# Patient Record
Sex: Male | Born: 1990 | Race: White | Hispanic: No | Marital: Single | Smoking: Never smoker
Health system: Southern US, Community
[De-identification: ages and names within clinical notes are randomized; demographics above are authoritative.]

## PROBLEM LIST (undated history)

## (undated) DIAGNOSIS — F419 Anxiety disorder, unspecified: Secondary | ICD-10-CM

## (undated) HISTORY — DX: Anxiety disorder, unspecified: F41.9

---
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
## 2011-03-09 LAB — URINALYSIS, ROUTINE W REFLEX MICROSCOPIC
Hgb urine dipstick: NEGATIVE
Nitrite: NEGATIVE
Specific Gravity, Urine: 1.018
Urobilinogen, UA: 0.2

## 2011-03-09 LAB — GC/CHLAMYDIA PROBE AMP, URINE
Chlamydia, Swab/Urine, PCR: NEGATIVE
GC Probe Amp, Urine: NEGATIVE

## 2011-03-09 LAB — RPR: RPR Ser Ql: NONREACTIVE

## 2015-09-01 DIAGNOSIS — F3342 Major depressive disorder, recurrent, in full remission: Secondary | ICD-10-CM | POA: Diagnosis not present

## 2015-09-15 DIAGNOSIS — F3342 Major depressive disorder, recurrent, in full remission: Secondary | ICD-10-CM | POA: Diagnosis not present

## 2015-10-06 DIAGNOSIS — F3342 Major depressive disorder, recurrent, in full remission: Secondary | ICD-10-CM | POA: Diagnosis not present

## 2015-11-17 DIAGNOSIS — F3342 Major depressive disorder, recurrent, in full remission: Secondary | ICD-10-CM | POA: Diagnosis not present

## 2015-12-18 DIAGNOSIS — K219 Gastro-esophageal reflux disease without esophagitis: Secondary | ICD-10-CM | POA: Diagnosis not present

## 2016-08-03 DIAGNOSIS — R202 Paresthesia of skin: Secondary | ICD-10-CM | POA: Diagnosis not present

## 2016-08-11 DIAGNOSIS — F3342 Major depressive disorder, recurrent, in full remission: Secondary | ICD-10-CM | POA: Diagnosis not present

## 2016-08-11 DIAGNOSIS — F1120 Opioid dependence, uncomplicated: Secondary | ICD-10-CM | POA: Diagnosis not present

## 2016-08-31 DIAGNOSIS — F3342 Major depressive disorder, recurrent, in full remission: Secondary | ICD-10-CM | POA: Diagnosis not present

## 2016-08-31 DIAGNOSIS — F1120 Opioid dependence, uncomplicated: Secondary | ICD-10-CM | POA: Diagnosis not present

## 2016-09-21 DIAGNOSIS — F1120 Opioid dependence, uncomplicated: Secondary | ICD-10-CM | POA: Diagnosis not present

## 2016-09-21 DIAGNOSIS — F3342 Major depressive disorder, recurrent, in full remission: Secondary | ICD-10-CM | POA: Diagnosis not present

## 2016-10-12 DIAGNOSIS — F1120 Opioid dependence, uncomplicated: Secondary | ICD-10-CM | POA: Diagnosis not present

## 2016-10-12 DIAGNOSIS — F3342 Major depressive disorder, recurrent, in full remission: Secondary | ICD-10-CM | POA: Diagnosis not present

## 2016-12-13 DIAGNOSIS — F1120 Opioid dependence, uncomplicated: Secondary | ICD-10-CM | POA: Diagnosis not present

## 2016-12-13 DIAGNOSIS — F3342 Major depressive disorder, recurrent, in full remission: Secondary | ICD-10-CM | POA: Diagnosis not present

## 2017-01-03 DIAGNOSIS — F1120 Opioid dependence, uncomplicated: Secondary | ICD-10-CM | POA: Diagnosis not present

## 2017-01-03 DIAGNOSIS — F3342 Major depressive disorder, recurrent, in full remission: Secondary | ICD-10-CM | POA: Diagnosis not present

## 2017-02-10 DIAGNOSIS — F3342 Major depressive disorder, recurrent, in full remission: Secondary | ICD-10-CM | POA: Diagnosis not present

## 2017-02-10 DIAGNOSIS — F1120 Opioid dependence, uncomplicated: Secondary | ICD-10-CM | POA: Diagnosis not present

## 2017-05-04 DIAGNOSIS — F1120 Opioid dependence, uncomplicated: Secondary | ICD-10-CM | POA: Diagnosis not present

## 2017-05-04 DIAGNOSIS — F3342 Major depressive disorder, recurrent, in full remission: Secondary | ICD-10-CM | POA: Diagnosis not present

## 2017-05-25 DIAGNOSIS — F1120 Opioid dependence, uncomplicated: Secondary | ICD-10-CM | POA: Diagnosis not present

## 2017-05-25 DIAGNOSIS — F3342 Major depressive disorder, recurrent, in full remission: Secondary | ICD-10-CM | POA: Diagnosis not present

## 2017-08-17 DIAGNOSIS — F1120 Opioid dependence, uncomplicated: Secondary | ICD-10-CM | POA: Diagnosis not present

## 2017-08-17 DIAGNOSIS — F3342 Major depressive disorder, recurrent, in full remission: Secondary | ICD-10-CM | POA: Diagnosis not present

## 2017-09-07 DIAGNOSIS — F1120 Opioid dependence, uncomplicated: Secondary | ICD-10-CM | POA: Diagnosis not present

## 2017-09-07 DIAGNOSIS — F3342 Major depressive disorder, recurrent, in full remission: Secondary | ICD-10-CM | POA: Diagnosis not present

## 2017-09-28 DIAGNOSIS — F1120 Opioid dependence, uncomplicated: Secondary | ICD-10-CM | POA: Diagnosis not present

## 2017-09-28 DIAGNOSIS — F3342 Major depressive disorder, recurrent, in full remission: Secondary | ICD-10-CM | POA: Diagnosis not present

## 2017-11-14 DIAGNOSIS — F3342 Major depressive disorder, recurrent, in full remission: Secondary | ICD-10-CM | POA: Diagnosis not present

## 2017-11-14 DIAGNOSIS — F1120 Opioid dependence, uncomplicated: Secondary | ICD-10-CM | POA: Diagnosis not present

## 2017-12-05 DIAGNOSIS — F3342 Major depressive disorder, recurrent, in full remission: Secondary | ICD-10-CM | POA: Diagnosis not present

## 2017-12-05 DIAGNOSIS — F1120 Opioid dependence, uncomplicated: Secondary | ICD-10-CM | POA: Diagnosis not present

## 2017-12-26 DIAGNOSIS — F3342 Major depressive disorder, recurrent, in full remission: Secondary | ICD-10-CM | POA: Diagnosis not present

## 2017-12-26 DIAGNOSIS — F1120 Opioid dependence, uncomplicated: Secondary | ICD-10-CM | POA: Diagnosis not present

## 2018-01-16 DIAGNOSIS — F3342 Major depressive disorder, recurrent, in full remission: Secondary | ICD-10-CM | POA: Diagnosis not present

## 2018-01-16 DIAGNOSIS — F1120 Opioid dependence, uncomplicated: Secondary | ICD-10-CM | POA: Diagnosis not present

## 2018-03-20 DIAGNOSIS — F3342 Major depressive disorder, recurrent, in full remission: Secondary | ICD-10-CM | POA: Diagnosis not present

## 2018-03-20 DIAGNOSIS — F1120 Opioid dependence, uncomplicated: Secondary | ICD-10-CM | POA: Diagnosis not present

## 2018-04-03 DIAGNOSIS — F3342 Major depressive disorder, recurrent, in full remission: Secondary | ICD-10-CM | POA: Diagnosis not present

## 2018-04-03 DIAGNOSIS — F1120 Opioid dependence, uncomplicated: Secondary | ICD-10-CM | POA: Diagnosis not present

## 2018-04-24 DIAGNOSIS — F1120 Opioid dependence, uncomplicated: Secondary | ICD-10-CM | POA: Diagnosis not present

## 2018-04-24 DIAGNOSIS — F3342 Major depressive disorder, recurrent, in full remission: Secondary | ICD-10-CM | POA: Diagnosis not present

## 2018-05-16 DIAGNOSIS — F1120 Opioid dependence, uncomplicated: Secondary | ICD-10-CM | POA: Diagnosis not present

## 2018-05-16 DIAGNOSIS — F3342 Major depressive disorder, recurrent, in full remission: Secondary | ICD-10-CM | POA: Diagnosis not present

## 2018-06-20 DIAGNOSIS — J329 Chronic sinusitis, unspecified: Secondary | ICD-10-CM | POA: Diagnosis not present

## 2018-06-20 DIAGNOSIS — H8122 Vestibular neuronitis, left ear: Secondary | ICD-10-CM | POA: Diagnosis not present

## 2018-06-20 DIAGNOSIS — Z6834 Body mass index (BMI) 34.0-34.9, adult: Secondary | ICD-10-CM | POA: Diagnosis not present

## 2018-06-27 DIAGNOSIS — F1120 Opioid dependence, uncomplicated: Secondary | ICD-10-CM | POA: Diagnosis not present

## 2018-06-27 DIAGNOSIS — F3342 Major depressive disorder, recurrent, in full remission: Secondary | ICD-10-CM | POA: Diagnosis not present

## 2018-07-18 DIAGNOSIS — F3342 Major depressive disorder, recurrent, in full remission: Secondary | ICD-10-CM | POA: Diagnosis not present

## 2018-08-08 DIAGNOSIS — F3342 Major depressive disorder, recurrent, in full remission: Secondary | ICD-10-CM | POA: Diagnosis not present

## 2018-10-09 DIAGNOSIS — F1120 Opioid dependence, uncomplicated: Secondary | ICD-10-CM | POA: Diagnosis not present

## 2018-10-09 DIAGNOSIS — F3342 Major depressive disorder, recurrent, in full remission: Secondary | ICD-10-CM | POA: Diagnosis not present

## 2018-11-16 DIAGNOSIS — F1120 Opioid dependence, uncomplicated: Secondary | ICD-10-CM | POA: Diagnosis not present

## 2018-11-16 DIAGNOSIS — F3342 Major depressive disorder, recurrent, in full remission: Secondary | ICD-10-CM | POA: Diagnosis not present

## 2019-01-18 DIAGNOSIS — F329 Major depressive disorder, single episode, unspecified: Secondary | ICD-10-CM | POA: Diagnosis not present

## 2019-01-18 DIAGNOSIS — F1120 Opioid dependence, uncomplicated: Secondary | ICD-10-CM | POA: Diagnosis not present

## 2019-05-17 DIAGNOSIS — J069 Acute upper respiratory infection, unspecified: Secondary | ICD-10-CM | POA: Diagnosis not present

## 2019-06-11 DIAGNOSIS — Z79899 Other long term (current) drug therapy: Secondary | ICD-10-CM | POA: Diagnosis not present

## 2019-06-15 DIAGNOSIS — J069 Acute upper respiratory infection, unspecified: Secondary | ICD-10-CM | POA: Diagnosis not present

## 2019-06-15 DIAGNOSIS — Z13220 Encounter for screening for lipoid disorders: Secondary | ICD-10-CM | POA: Diagnosis not present

## 2019-06-15 DIAGNOSIS — Z0000 Encounter for general adult medical examination without abnormal findings: Secondary | ICD-10-CM | POA: Diagnosis not present

## 2019-06-15 DIAGNOSIS — Z131 Encounter for screening for diabetes mellitus: Secondary | ICD-10-CM | POA: Diagnosis not present

## 2019-06-20 DIAGNOSIS — J069 Acute upper respiratory infection, unspecified: Secondary | ICD-10-CM | POA: Diagnosis not present

## 2019-06-20 DIAGNOSIS — Z20828 Contact with and (suspected) exposure to other viral communicable diseases: Secondary | ICD-10-CM | POA: Diagnosis not present

## 2019-06-26 ENCOUNTER — Ambulatory Visit
Admission: RE | Admit: 2019-06-26 | Discharge: 2019-06-26 | Disposition: A | Discharge status: Home / Self Care | Payer: BC Managed Care – PPO | Source: Ambulatory Visit | Attending: Obstetrics and Gynecology | Admitting: Obstetrics and Gynecology

## 2019-06-26 ENCOUNTER — Other Ambulatory Visit: Payer: Self-pay | Admitting: Obstetrics and Gynecology

## 2019-06-26 DIAGNOSIS — R509 Fever, unspecified: Secondary | ICD-10-CM

## 2019-06-26 DIAGNOSIS — Z20828 Contact with and (suspected) exposure to other viral communicable diseases: Secondary | ICD-10-CM | POA: Diagnosis not present

## 2019-06-26 DIAGNOSIS — J069 Acute upper respiratory infection, unspecified: Secondary | ICD-10-CM | POA: Diagnosis not present

## 2019-07-09 DIAGNOSIS — Z79899 Other long term (current) drug therapy: Secondary | ICD-10-CM | POA: Diagnosis not present

## 2020-03-11 DIAGNOSIS — Z79899 Other long term (current) drug therapy: Secondary | ICD-10-CM | POA: Diagnosis not present

## 2020-03-11 DIAGNOSIS — F411 Generalized anxiety disorder: Secondary | ICD-10-CM | POA: Diagnosis not present

## 2020-03-11 DIAGNOSIS — F1121 Opioid dependence, in remission: Secondary | ICD-10-CM | POA: Diagnosis not present

## 2020-03-11 DIAGNOSIS — F330 Major depressive disorder, recurrent, mild: Secondary | ICD-10-CM | POA: Diagnosis not present

## 2020-03-25 DIAGNOSIS — F411 Generalized anxiety disorder: Secondary | ICD-10-CM | POA: Diagnosis not present

## 2020-03-25 DIAGNOSIS — F1121 Opioid dependence, in remission: Secondary | ICD-10-CM | POA: Diagnosis not present

## 2020-03-25 DIAGNOSIS — Z79899 Other long term (current) drug therapy: Secondary | ICD-10-CM | POA: Diagnosis not present

## 2020-03-25 DIAGNOSIS — F330 Major depressive disorder, recurrent, mild: Secondary | ICD-10-CM | POA: Diagnosis not present

## 2020-04-08 DIAGNOSIS — F1121 Opioid dependence, in remission: Secondary | ICD-10-CM | POA: Diagnosis not present

## 2020-04-08 DIAGNOSIS — F330 Major depressive disorder, recurrent, mild: Secondary | ICD-10-CM | POA: Diagnosis not present

## 2020-04-08 DIAGNOSIS — F1910 Other psychoactive substance abuse, uncomplicated: Secondary | ICD-10-CM | POA: Diagnosis not present

## 2020-04-08 DIAGNOSIS — F411 Generalized anxiety disorder: Secondary | ICD-10-CM | POA: Diagnosis not present

## 2020-04-22 DIAGNOSIS — Z79899 Other long term (current) drug therapy: Secondary | ICD-10-CM | POA: Diagnosis not present

## 2020-04-22 DIAGNOSIS — F411 Generalized anxiety disorder: Secondary | ICD-10-CM | POA: Diagnosis not present

## 2020-04-22 DIAGNOSIS — F1121 Opioid dependence, in remission: Secondary | ICD-10-CM | POA: Diagnosis not present

## 2020-04-22 DIAGNOSIS — F330 Major depressive disorder, recurrent, mild: Secondary | ICD-10-CM | POA: Diagnosis not present

## 2020-05-06 DIAGNOSIS — F411 Generalized anxiety disorder: Secondary | ICD-10-CM | POA: Diagnosis not present

## 2020-05-06 DIAGNOSIS — Z79899 Other long term (current) drug therapy: Secondary | ICD-10-CM | POA: Diagnosis not present

## 2020-05-06 DIAGNOSIS — F1121 Opioid dependence, in remission: Secondary | ICD-10-CM | POA: Diagnosis not present

## 2020-05-06 DIAGNOSIS — F330 Major depressive disorder, recurrent, mild: Secondary | ICD-10-CM | POA: Diagnosis not present

## 2020-05-20 DIAGNOSIS — F411 Generalized anxiety disorder: Secondary | ICD-10-CM | POA: Diagnosis not present

## 2020-05-20 DIAGNOSIS — F330 Major depressive disorder, recurrent, mild: Secondary | ICD-10-CM | POA: Diagnosis not present

## 2020-05-20 DIAGNOSIS — Z79899 Other long term (current) drug therapy: Secondary | ICD-10-CM | POA: Diagnosis not present

## 2020-05-20 DIAGNOSIS — F1121 Opioid dependence, in remission: Secondary | ICD-10-CM | POA: Diagnosis not present

## 2020-06-03 DIAGNOSIS — Z79899 Other long term (current) drug therapy: Secondary | ICD-10-CM | POA: Diagnosis not present

## 2020-06-03 DIAGNOSIS — F411 Generalized anxiety disorder: Secondary | ICD-10-CM | POA: Diagnosis not present

## 2020-06-03 DIAGNOSIS — F1121 Opioid dependence, in remission: Secondary | ICD-10-CM | POA: Diagnosis not present

## 2020-06-03 DIAGNOSIS — F330 Major depressive disorder, recurrent, mild: Secondary | ICD-10-CM | POA: Diagnosis not present

## 2020-06-17 DIAGNOSIS — F411 Generalized anxiety disorder: Secondary | ICD-10-CM | POA: Diagnosis not present

## 2020-06-17 DIAGNOSIS — F1121 Opioid dependence, in remission: Secondary | ICD-10-CM | POA: Diagnosis not present

## 2020-06-17 DIAGNOSIS — F330 Major depressive disorder, recurrent, mild: Secondary | ICD-10-CM | POA: Diagnosis not present

## 2020-06-17 DIAGNOSIS — Z79899 Other long term (current) drug therapy: Secondary | ICD-10-CM | POA: Diagnosis not present

## 2020-07-01 DIAGNOSIS — F411 Generalized anxiety disorder: Secondary | ICD-10-CM | POA: Diagnosis not present

## 2020-07-01 DIAGNOSIS — F1121 Opioid dependence, in remission: Secondary | ICD-10-CM | POA: Diagnosis not present

## 2020-07-01 DIAGNOSIS — F330 Major depressive disorder, recurrent, mild: Secondary | ICD-10-CM | POA: Diagnosis not present

## 2020-07-01 DIAGNOSIS — Z79899 Other long term (current) drug therapy: Secondary | ICD-10-CM | POA: Diagnosis not present

## 2020-07-15 DIAGNOSIS — F411 Generalized anxiety disorder: Secondary | ICD-10-CM | POA: Diagnosis not present

## 2020-07-15 DIAGNOSIS — F330 Major depressive disorder, recurrent, mild: Secondary | ICD-10-CM | POA: Diagnosis not present

## 2020-07-15 DIAGNOSIS — Z79899 Other long term (current) drug therapy: Secondary | ICD-10-CM | POA: Diagnosis not present

## 2020-07-15 DIAGNOSIS — F1121 Opioid dependence, in remission: Secondary | ICD-10-CM | POA: Diagnosis not present

## 2020-08-04 DIAGNOSIS — F411 Generalized anxiety disorder: Secondary | ICD-10-CM | POA: Diagnosis not present

## 2020-08-04 DIAGNOSIS — Z79899 Other long term (current) drug therapy: Secondary | ICD-10-CM | POA: Diagnosis not present

## 2020-08-04 DIAGNOSIS — F330 Major depressive disorder, recurrent, mild: Secondary | ICD-10-CM | POA: Diagnosis not present

## 2020-08-04 DIAGNOSIS — F1121 Opioid dependence, in remission: Secondary | ICD-10-CM | POA: Diagnosis not present

## 2020-08-18 DIAGNOSIS — F411 Generalized anxiety disorder: Secondary | ICD-10-CM | POA: Diagnosis not present

## 2020-08-18 DIAGNOSIS — Z79899 Other long term (current) drug therapy: Secondary | ICD-10-CM | POA: Diagnosis not present

## 2020-08-18 DIAGNOSIS — F1121 Opioid dependence, in remission: Secondary | ICD-10-CM | POA: Diagnosis not present

## 2020-08-18 DIAGNOSIS — F330 Major depressive disorder, recurrent, mild: Secondary | ICD-10-CM | POA: Diagnosis not present

## 2020-09-01 DIAGNOSIS — F411 Generalized anxiety disorder: Secondary | ICD-10-CM | POA: Diagnosis not present

## 2020-09-01 DIAGNOSIS — F1121 Opioid dependence, in remission: Secondary | ICD-10-CM | POA: Diagnosis not present

## 2020-09-01 DIAGNOSIS — Z79899 Other long term (current) drug therapy: Secondary | ICD-10-CM | POA: Diagnosis not present

## 2020-09-01 DIAGNOSIS — F330 Major depressive disorder, recurrent, mild: Secondary | ICD-10-CM | POA: Diagnosis not present

## 2020-09-15 DIAGNOSIS — F411 Generalized anxiety disorder: Secondary | ICD-10-CM | POA: Diagnosis not present

## 2020-09-15 DIAGNOSIS — F1121 Opioid dependence, in remission: Secondary | ICD-10-CM | POA: Diagnosis not present

## 2020-09-15 DIAGNOSIS — Z79899 Other long term (current) drug therapy: Secondary | ICD-10-CM | POA: Diagnosis not present

## 2020-09-15 DIAGNOSIS — F330 Major depressive disorder, recurrent, mild: Secondary | ICD-10-CM | POA: Diagnosis not present

## 2020-09-26 DIAGNOSIS — F411 Generalized anxiety disorder: Secondary | ICD-10-CM | POA: Diagnosis not present

## 2020-09-26 DIAGNOSIS — F1121 Opioid dependence, in remission: Secondary | ICD-10-CM | POA: Diagnosis not present

## 2020-09-26 DIAGNOSIS — Z79899 Other long term (current) drug therapy: Secondary | ICD-10-CM | POA: Diagnosis not present

## 2020-09-26 DIAGNOSIS — F330 Major depressive disorder, recurrent, mild: Secondary | ICD-10-CM | POA: Diagnosis not present

## 2020-10-10 DIAGNOSIS — F1121 Opioid dependence, in remission: Secondary | ICD-10-CM | POA: Diagnosis not present

## 2020-10-10 DIAGNOSIS — F411 Generalized anxiety disorder: Secondary | ICD-10-CM | POA: Diagnosis not present

## 2020-10-10 DIAGNOSIS — F330 Major depressive disorder, recurrent, mild: Secondary | ICD-10-CM | POA: Diagnosis not present

## 2020-10-10 DIAGNOSIS — Z79899 Other long term (current) drug therapy: Secondary | ICD-10-CM | POA: Diagnosis not present

## 2020-10-24 DIAGNOSIS — F330 Major depressive disorder, recurrent, mild: Secondary | ICD-10-CM | POA: Diagnosis not present

## 2020-10-24 DIAGNOSIS — F1910 Other psychoactive substance abuse, uncomplicated: Secondary | ICD-10-CM | POA: Diagnosis not present

## 2020-10-24 DIAGNOSIS — F411 Generalized anxiety disorder: Secondary | ICD-10-CM | POA: Diagnosis not present

## 2020-10-24 DIAGNOSIS — F1121 Opioid dependence, in remission: Secondary | ICD-10-CM | POA: Diagnosis not present

## 2020-11-07 DIAGNOSIS — F1121 Opioid dependence, in remission: Secondary | ICD-10-CM | POA: Diagnosis not present

## 2020-11-07 DIAGNOSIS — Z6836 Body mass index (BMI) 36.0-36.9, adult: Secondary | ICD-10-CM | POA: Diagnosis not present

## 2020-11-07 DIAGNOSIS — Z79899 Other long term (current) drug therapy: Secondary | ICD-10-CM | POA: Diagnosis not present

## 2020-11-07 DIAGNOSIS — F411 Generalized anxiety disorder: Secondary | ICD-10-CM | POA: Diagnosis not present

## 2020-11-07 DIAGNOSIS — F330 Major depressive disorder, recurrent, mild: Secondary | ICD-10-CM | POA: Diagnosis not present

## 2020-11-28 DIAGNOSIS — F330 Major depressive disorder, recurrent, mild: Secondary | ICD-10-CM | POA: Diagnosis not present

## 2020-11-28 DIAGNOSIS — F1910 Other psychoactive substance abuse, uncomplicated: Secondary | ICD-10-CM | POA: Diagnosis not present

## 2020-11-28 DIAGNOSIS — F1121 Opioid dependence, in remission: Secondary | ICD-10-CM | POA: Diagnosis not present

## 2020-11-28 DIAGNOSIS — F411 Generalized anxiety disorder: Secondary | ICD-10-CM | POA: Diagnosis not present

## 2020-12-12 DIAGNOSIS — F1910 Other psychoactive substance abuse, uncomplicated: Secondary | ICD-10-CM | POA: Diagnosis not present

## 2020-12-12 DIAGNOSIS — F1121 Opioid dependence, in remission: Secondary | ICD-10-CM | POA: Diagnosis not present

## 2020-12-12 DIAGNOSIS — F330 Major depressive disorder, recurrent, mild: Secondary | ICD-10-CM | POA: Diagnosis not present

## 2020-12-12 DIAGNOSIS — F411 Generalized anxiety disorder: Secondary | ICD-10-CM | POA: Diagnosis not present

## 2020-12-26 DIAGNOSIS — F330 Major depressive disorder, recurrent, mild: Secondary | ICD-10-CM | POA: Diagnosis not present

## 2020-12-26 DIAGNOSIS — F411 Generalized anxiety disorder: Secondary | ICD-10-CM | POA: Diagnosis not present

## 2020-12-26 DIAGNOSIS — F1121 Opioid dependence, in remission: Secondary | ICD-10-CM | POA: Diagnosis not present

## 2020-12-26 DIAGNOSIS — F1910 Other psychoactive substance abuse, uncomplicated: Secondary | ICD-10-CM | POA: Diagnosis not present

## 2021-01-08 DIAGNOSIS — F411 Generalized anxiety disorder: Secondary | ICD-10-CM | POA: Diagnosis not present

## 2021-01-08 DIAGNOSIS — F1910 Other psychoactive substance abuse, uncomplicated: Secondary | ICD-10-CM | POA: Diagnosis not present

## 2021-01-08 DIAGNOSIS — F1121 Opioid dependence, in remission: Secondary | ICD-10-CM | POA: Diagnosis not present

## 2021-01-08 DIAGNOSIS — F330 Major depressive disorder, recurrent, mild: Secondary | ICD-10-CM | POA: Diagnosis not present

## 2021-01-27 DIAGNOSIS — Z79899 Other long term (current) drug therapy: Secondary | ICD-10-CM | POA: Diagnosis not present

## 2021-01-27 DIAGNOSIS — F411 Generalized anxiety disorder: Secondary | ICD-10-CM | POA: Diagnosis not present

## 2021-01-27 DIAGNOSIS — F1120 Opioid dependence, uncomplicated: Secondary | ICD-10-CM | POA: Diagnosis not present

## 2021-01-27 DIAGNOSIS — Z6836 Body mass index (BMI) 36.0-36.9, adult: Secondary | ICD-10-CM | POA: Diagnosis not present

## 2021-01-27 DIAGNOSIS — F330 Major depressive disorder, recurrent, mild: Secondary | ICD-10-CM | POA: Diagnosis not present

## 2021-02-10 DIAGNOSIS — F1910 Other psychoactive substance abuse, uncomplicated: Secondary | ICD-10-CM | POA: Diagnosis not present

## 2021-02-10 DIAGNOSIS — F411 Generalized anxiety disorder: Secondary | ICD-10-CM | POA: Diagnosis not present

## 2021-02-10 DIAGNOSIS — F1120 Opioid dependence, uncomplicated: Secondary | ICD-10-CM | POA: Diagnosis not present

## 2021-02-10 DIAGNOSIS — F330 Major depressive disorder, recurrent, mild: Secondary | ICD-10-CM | POA: Diagnosis not present

## 2021-02-24 DIAGNOSIS — F411 Generalized anxiety disorder: Secondary | ICD-10-CM | POA: Diagnosis not present

## 2021-02-24 DIAGNOSIS — Z79899 Other long term (current) drug therapy: Secondary | ICD-10-CM | POA: Diagnosis not present

## 2021-02-24 DIAGNOSIS — Z6836 Body mass index (BMI) 36.0-36.9, adult: Secondary | ICD-10-CM | POA: Diagnosis not present

## 2021-02-24 DIAGNOSIS — F330 Major depressive disorder, recurrent, mild: Secondary | ICD-10-CM | POA: Diagnosis not present

## 2021-02-24 DIAGNOSIS — F1120 Opioid dependence, uncomplicated: Secondary | ICD-10-CM | POA: Diagnosis not present

## 2021-03-24 DIAGNOSIS — F1910 Other psychoactive substance abuse, uncomplicated: Secondary | ICD-10-CM | POA: Diagnosis not present

## 2021-03-24 DIAGNOSIS — F330 Major depressive disorder, recurrent, mild: Secondary | ICD-10-CM | POA: Diagnosis not present

## 2021-03-24 DIAGNOSIS — F1120 Opioid dependence, uncomplicated: Secondary | ICD-10-CM | POA: Diagnosis not present

## 2021-03-24 DIAGNOSIS — F411 Generalized anxiety disorder: Secondary | ICD-10-CM | POA: Diagnosis not present

## 2021-04-08 ENCOUNTER — Other Ambulatory Visit: Payer: Self-pay

## 2021-04-08 ENCOUNTER — Other Ambulatory Visit: Payer: Self-pay | Admitting: Family

## 2021-04-08 ENCOUNTER — Ambulatory Visit
Admission: RE | Admit: 2021-04-08 | Discharge: 2021-04-08 | Disposition: A | Discharge status: Home / Self Care | Payer: BC Managed Care – PPO | Source: Ambulatory Visit | Attending: Family | Admitting: Family

## 2021-04-08 DIAGNOSIS — R52 Pain, unspecified: Secondary | ICD-10-CM

## 2021-04-08 DIAGNOSIS — M545 Low back pain, unspecified: Secondary | ICD-10-CM | POA: Diagnosis not present

## 2021-04-09 DIAGNOSIS — F411 Generalized anxiety disorder: Secondary | ICD-10-CM | POA: Diagnosis not present

## 2021-04-09 DIAGNOSIS — F1120 Opioid dependence, uncomplicated: Secondary | ICD-10-CM | POA: Diagnosis not present

## 2021-04-09 DIAGNOSIS — F330 Major depressive disorder, recurrent, mild: Secondary | ICD-10-CM | POA: Diagnosis not present

## 2021-04-09 DIAGNOSIS — Z79899 Other long term (current) drug therapy: Secondary | ICD-10-CM | POA: Diagnosis not present

## 2021-04-22 DIAGNOSIS — Z79899 Other long term (current) drug therapy: Secondary | ICD-10-CM | POA: Diagnosis not present

## 2021-04-22 DIAGNOSIS — F411 Generalized anxiety disorder: Secondary | ICD-10-CM | POA: Diagnosis not present

## 2021-04-22 DIAGNOSIS — F1120 Opioid dependence, uncomplicated: Secondary | ICD-10-CM | POA: Diagnosis not present

## 2021-04-22 DIAGNOSIS — F330 Major depressive disorder, recurrent, mild: Secondary | ICD-10-CM | POA: Diagnosis not present

## 2021-05-06 DIAGNOSIS — F411 Generalized anxiety disorder: Secondary | ICD-10-CM | POA: Diagnosis not present

## 2021-05-06 DIAGNOSIS — F1120 Opioid dependence, uncomplicated: Secondary | ICD-10-CM | POA: Diagnosis not present

## 2021-05-06 DIAGNOSIS — F330 Major depressive disorder, recurrent, mild: Secondary | ICD-10-CM | POA: Diagnosis not present

## 2021-05-06 DIAGNOSIS — Z6833 Body mass index (BMI) 33.0-33.9, adult: Secondary | ICD-10-CM | POA: Diagnosis not present

## 2021-05-06 DIAGNOSIS — Z79899 Other long term (current) drug therapy: Secondary | ICD-10-CM | POA: Diagnosis not present

## 2021-05-20 DIAGNOSIS — F1120 Opioid dependence, uncomplicated: Secondary | ICD-10-CM | POA: Diagnosis not present

## 2021-05-20 DIAGNOSIS — F330 Major depressive disorder, recurrent, mild: Secondary | ICD-10-CM | POA: Diagnosis not present

## 2021-05-20 DIAGNOSIS — F411 Generalized anxiety disorder: Secondary | ICD-10-CM | POA: Diagnosis not present

## 2021-06-03 DIAGNOSIS — F1120 Opioid dependence, uncomplicated: Secondary | ICD-10-CM | POA: Diagnosis not present

## 2021-06-03 DIAGNOSIS — Z6833 Body mass index (BMI) 33.0-33.9, adult: Secondary | ICD-10-CM | POA: Diagnosis not present

## 2021-06-03 DIAGNOSIS — F330 Major depressive disorder, recurrent, mild: Secondary | ICD-10-CM | POA: Diagnosis not present

## 2021-06-03 DIAGNOSIS — F411 Generalized anxiety disorder: Secondary | ICD-10-CM | POA: Diagnosis not present

## 2021-06-03 DIAGNOSIS — Z79899 Other long term (current) drug therapy: Secondary | ICD-10-CM | POA: Diagnosis not present

## 2021-06-04 DIAGNOSIS — Z6832 Body mass index (BMI) 32.0-32.9, adult: Secondary | ICD-10-CM | POA: Diagnosis not present

## 2021-06-04 DIAGNOSIS — F332 Major depressive disorder, recurrent severe without psychotic features: Secondary | ICD-10-CM | POA: Diagnosis not present

## 2021-06-04 DIAGNOSIS — E669 Obesity, unspecified: Secondary | ICD-10-CM | POA: Diagnosis not present

## 2021-06-17 DIAGNOSIS — Z79899 Other long term (current) drug therapy: Secondary | ICD-10-CM | POA: Diagnosis not present

## 2021-06-17 DIAGNOSIS — F330 Major depressive disorder, recurrent, mild: Secondary | ICD-10-CM | POA: Diagnosis not present

## 2021-06-17 DIAGNOSIS — F1120 Opioid dependence, uncomplicated: Secondary | ICD-10-CM | POA: Diagnosis not present

## 2021-06-17 DIAGNOSIS — F411 Generalized anxiety disorder: Secondary | ICD-10-CM | POA: Diagnosis not present

## 2021-06-19 DIAGNOSIS — Z6831 Body mass index (BMI) 31.0-31.9, adult: Secondary | ICD-10-CM | POA: Diagnosis not present

## 2021-06-19 DIAGNOSIS — F332 Major depressive disorder, recurrent severe without psychotic features: Secondary | ICD-10-CM | POA: Diagnosis not present

## 2021-06-19 DIAGNOSIS — E669 Obesity, unspecified: Secondary | ICD-10-CM | POA: Diagnosis not present

## 2021-07-01 DIAGNOSIS — F330 Major depressive disorder, recurrent, mild: Secondary | ICD-10-CM | POA: Diagnosis not present

## 2021-07-01 DIAGNOSIS — F411 Generalized anxiety disorder: Secondary | ICD-10-CM | POA: Diagnosis not present

## 2021-07-01 DIAGNOSIS — F1120 Opioid dependence, uncomplicated: Secondary | ICD-10-CM | POA: Diagnosis not present

## 2021-07-01 DIAGNOSIS — Z79899 Other long term (current) drug therapy: Secondary | ICD-10-CM | POA: Diagnosis not present

## 2021-07-15 DIAGNOSIS — F1120 Opioid dependence, uncomplicated: Secondary | ICD-10-CM | POA: Diagnosis not present

## 2021-07-15 DIAGNOSIS — F330 Major depressive disorder, recurrent, mild: Secondary | ICD-10-CM | POA: Diagnosis not present

## 2021-07-15 DIAGNOSIS — Z79899 Other long term (current) drug therapy: Secondary | ICD-10-CM | POA: Diagnosis not present

## 2021-07-15 DIAGNOSIS — F411 Generalized anxiety disorder: Secondary | ICD-10-CM | POA: Diagnosis not present

## 2021-07-29 DIAGNOSIS — F330 Major depressive disorder, recurrent, mild: Secondary | ICD-10-CM | POA: Diagnosis not present

## 2021-07-29 DIAGNOSIS — F1120 Opioid dependence, uncomplicated: Secondary | ICD-10-CM | POA: Diagnosis not present

## 2021-07-29 DIAGNOSIS — Z79899 Other long term (current) drug therapy: Secondary | ICD-10-CM | POA: Diagnosis not present

## 2021-07-29 DIAGNOSIS — Z6833 Body mass index (BMI) 33.0-33.9, adult: Secondary | ICD-10-CM | POA: Diagnosis not present

## 2021-07-29 DIAGNOSIS — F411 Generalized anxiety disorder: Secondary | ICD-10-CM | POA: Diagnosis not present

## 2021-08-12 DIAGNOSIS — F330 Major depressive disorder, recurrent, mild: Secondary | ICD-10-CM | POA: Diagnosis not present

## 2021-08-12 DIAGNOSIS — Z6833 Body mass index (BMI) 33.0-33.9, adult: Secondary | ICD-10-CM | POA: Diagnosis not present

## 2021-08-12 DIAGNOSIS — F1120 Opioid dependence, uncomplicated: Secondary | ICD-10-CM | POA: Diagnosis not present

## 2021-08-12 DIAGNOSIS — F411 Generalized anxiety disorder: Secondary | ICD-10-CM | POA: Diagnosis not present

## 2021-08-26 DIAGNOSIS — Z6829 Body mass index (BMI) 29.0-29.9, adult: Secondary | ICD-10-CM | POA: Diagnosis not present

## 2021-08-26 DIAGNOSIS — Z79899 Other long term (current) drug therapy: Secondary | ICD-10-CM | POA: Diagnosis not present

## 2021-08-26 DIAGNOSIS — F1120 Opioid dependence, uncomplicated: Secondary | ICD-10-CM | POA: Diagnosis not present

## 2021-08-26 DIAGNOSIS — F330 Major depressive disorder, recurrent, mild: Secondary | ICD-10-CM | POA: Diagnosis not present

## 2021-08-26 DIAGNOSIS — F411 Generalized anxiety disorder: Secondary | ICD-10-CM | POA: Diagnosis not present

## 2021-09-09 DIAGNOSIS — F330 Major depressive disorder, recurrent, mild: Secondary | ICD-10-CM | POA: Diagnosis not present

## 2021-09-09 DIAGNOSIS — Z6829 Body mass index (BMI) 29.0-29.9, adult: Secondary | ICD-10-CM | POA: Diagnosis not present

## 2021-09-09 DIAGNOSIS — F411 Generalized anxiety disorder: Secondary | ICD-10-CM | POA: Diagnosis not present

## 2021-09-09 DIAGNOSIS — F1120 Opioid dependence, uncomplicated: Secondary | ICD-10-CM | POA: Diagnosis not present

## 2021-09-22 DIAGNOSIS — E663 Overweight: Secondary | ICD-10-CM | POA: Diagnosis not present

## 2021-09-22 DIAGNOSIS — F332 Major depressive disorder, recurrent severe without psychotic features: Secondary | ICD-10-CM | POA: Diagnosis not present

## 2021-09-22 DIAGNOSIS — Z6827 Body mass index (BMI) 27.0-27.9, adult: Secondary | ICD-10-CM | POA: Diagnosis not present

## 2021-09-23 DIAGNOSIS — Z79899 Other long term (current) drug therapy: Secondary | ICD-10-CM | POA: Diagnosis not present

## 2021-09-23 DIAGNOSIS — F330 Major depressive disorder, recurrent, mild: Secondary | ICD-10-CM | POA: Diagnosis not present

## 2021-09-23 DIAGNOSIS — F411 Generalized anxiety disorder: Secondary | ICD-10-CM | POA: Diagnosis not present

## 2021-09-23 DIAGNOSIS — F1120 Opioid dependence, uncomplicated: Secondary | ICD-10-CM | POA: Diagnosis not present

## 2021-09-23 DIAGNOSIS — Z6829 Body mass index (BMI) 29.0-29.9, adult: Secondary | ICD-10-CM | POA: Diagnosis not present

## 2021-10-07 DIAGNOSIS — F1120 Opioid dependence, uncomplicated: Secondary | ICD-10-CM | POA: Diagnosis not present

## 2021-10-07 DIAGNOSIS — F411 Generalized anxiety disorder: Secondary | ICD-10-CM | POA: Diagnosis not present

## 2021-10-07 DIAGNOSIS — Z6829 Body mass index (BMI) 29.0-29.9, adult: Secondary | ICD-10-CM | POA: Diagnosis not present

## 2021-10-07 DIAGNOSIS — F330 Major depressive disorder, recurrent, mild: Secondary | ICD-10-CM | POA: Diagnosis not present

## 2021-10-21 DIAGNOSIS — Z6829 Body mass index (BMI) 29.0-29.9, adult: Secondary | ICD-10-CM | POA: Diagnosis not present

## 2021-10-21 DIAGNOSIS — Z79899 Other long term (current) drug therapy: Secondary | ICD-10-CM | POA: Diagnosis not present

## 2021-10-21 DIAGNOSIS — F330 Major depressive disorder, recurrent, mild: Secondary | ICD-10-CM | POA: Diagnosis not present

## 2021-10-21 DIAGNOSIS — F411 Generalized anxiety disorder: Secondary | ICD-10-CM | POA: Diagnosis not present

## 2021-10-21 DIAGNOSIS — F1120 Opioid dependence, uncomplicated: Secondary | ICD-10-CM | POA: Diagnosis not present

## 2021-11-03 ENCOUNTER — Ambulatory Visit
Admission: EM | Admit: 2021-11-03 | Discharge: 2021-11-03 | Disposition: A | Discharge status: Home / Self Care | Payer: BC Managed Care – PPO | Attending: Nurse Practitioner | Admitting: Nurse Practitioner

## 2021-11-03 DIAGNOSIS — J029 Acute pharyngitis, unspecified: Secondary | ICD-10-CM | POA: Diagnosis not present

## 2021-11-03 DIAGNOSIS — J069 Acute upper respiratory infection, unspecified: Secondary | ICD-10-CM

## 2021-11-03 LAB — POCT RAPID STREP A (OFFICE): Rapid Strep A Screen: NEGATIVE

## 2021-11-03 MED ORDER — LIDOCAINE VISCOUS HCL 2 % MT SOLN: 5.0000 mL | OROMUCOSAL | 0 refills | Status: AC | PRN

## 2021-11-03 NOTE — ED Provider Notes (Signed)
[facility]    CSN: [id number] Arrival date & time: 11/03/21  1023      History   Chief Complaint Chief Complaint  Patient presents with   Sore Throat    Sore throat and head stopped up    HPI Scott Maxwell is a 31 y.o. male.   The history is provided by the patient.   Presents for complaints of sore throat that has been present for the past 2 days.  Over the past 24 hours, patient has since developed fever and nasal congestion.  He denies headache, ear pain, cough, shortness of breath, or wheezing.  He also denies any GI symptoms.  States he has been taking Advil Cold and Sinus for his symptoms.  States he was around his nieces and nephews who were sick, but for short duration.  History reviewed. No pertinent past medical history.  There are no problems to display for this patient.   History reviewed. No pertinent surgical history.     Home Medications    Prior to Admission medications   Medication Sig Start Date End Date Taking? Authorizing Provider  lidocaine (XYLOCAINE) 2 % solution Use as directed 5 mLs in the mouth or throat as needed for mouth pain. Gargle and spit 5mL as needed for throat pain. 11/03/21  Yes Clevie Prout-Warren, Sadie Haberhristie J, NP  buPROPion (WELLBUTRIN XL) 150 MG 24 hr tablet Take 150 mg by mouth daily. 09/25/21   [provider]  citalopram (CELEXA) 40 MG tablet Take 40 mg by mouth daily. 10/07/21   [provider]  venlafaxine XR (EFFEXOR-XR) 37.5 MG 24 hr capsule Take 37.5 mg by mouth daily. 06/05/21   [provider]    Family History Family History  Problem Relation Age of Onset   Anxiety disorder Mother     Social History Social History   Tobacco Use   Smoking status: Never    Passive exposure: Never   Smokeless tobacco: Never  Vaping Use   Vaping Use: Every day  Substance Use Topics   Alcohol use: Not Currently   Drug use: Not Currently     Allergies   Sulfa antibiotics   Review of  Systems Review of Systems Per HPI  Physical Exam Triage Vital Signs ED Triage Vitals  Enc Vitals Group     BP 11/03/21 1030 134/83     Pulse Rate 11/03/21 1030 87     Resp 11/03/21 1030 20     Temp 11/03/21 1030 98.7 F (37.1 C)     Temp Source 11/03/21 1030 Oral     SpO2 11/03/21 1030 95 %     Weight --      Height --      Head Circumference --      Peak Flow --      Pain Score 11/03/21 1031 8     Pain Loc --      Pain Edu? --      Excl. in GC? --    No data found.  Updated Vital Signs BP 134/83 (BP Location: Right Arm)   Pulse 87   Temp 98.7 F (37.1 C) (Oral)   Resp 20   SpO2 95%   Visual Acuity Right Eye Distance:   Left Eye Distance:   Bilateral Distance:    Right Eye Near:   Left Eye Near:    Bilateral Near:     Physical Exam Vitals and nursing note reviewed.  Constitutional:      General: He  is not in acute distress.    Appearance: He is well-developed.  HENT:     Head: Normocephalic.     Right Ear: Tympanic membrane and ear canal normal.     Left Ear: Tympanic membrane and ear canal normal.     Nose: Congestion present.     Right Turbinates: Enlarged and swollen.     Left Turbinates: Enlarged and swollen.     Right Sinus: No maxillary sinus tenderness or frontal sinus tenderness.     Left Sinus: No maxillary sinus tenderness or frontal sinus tenderness.     Mouth/Throat:     Pharynx: Uvula midline. Pharyngeal swelling and posterior oropharyngeal erythema present.     Tonsils: 1+ on the right. 1+ on the left.  Eyes:     Conjunctiva/sclera: Conjunctivae normal.     Pupils: Pupils are equal, round, and reactive to light.  Cardiovascular:     Rate and Rhythm: Normal rate and regular rhythm.     Heart sounds: Normal heart sounds.  Pulmonary:     Effort: Pulmonary effort is normal.     Breath sounds: Normal breath sounds.  Abdominal:     General: Bowel sounds are normal.     Palpations: Abdomen is soft.  Musculoskeletal:     Cervical back:  Normal range of motion.  Lymphadenopathy:     Cervical: No cervical adenopathy.  Skin:    General: Skin is warm and dry.  Neurological:     General: No focal deficit present.     Mental Status: He is alert and oriented to person, place, and time.     UC Treatments / Results  Labs (all labs ordered are listed, but only abnormal results are displayed) Labs Reviewed  CULTURE, GROUP A STREP ([facility])  COVID-19, FLU A+B NAA  POCT RAPID STREP A (OFFICE)    EKG   Radiology No results found.  Procedures Procedures (including critical care time)  Medications Ordered in UC Medications - No data to display  Initial Impression / Assessment and Plan / UC Course  I have reviewed the triage vital signs and the nursing notes.  Pertinent labs & imaging results that were available during my care of the patient were reviewed by me and considered in my medical decision making (see chart for details).  Patient presents with upper respiratory symptoms that have been present for the past 2 days.  His vital signs are stable, exam is reassuring.  Rapid strep test is negative, COVID/flu and throat culture pending.  Supportive care recommendations were provided.  Patient advised to follow-up as needed. Final Clinical Impressions(s) / UC Diagnoses   Final diagnoses:  Sore throat  Viral upper respiratory infection     Discharge Instructions      Rapid strep test is negative, COVID/flu and throat culture are pending.  Take medication as prescribed. Increase fluids and allow for plenty of rest. Recommend Tylenol or ibuprofen as needed for pain, fever, or general discomfort. Recommend throat lozenges, Chloraseptic or honey to help with throat pain. Warm salt water gargles 3-4 times daily to help with throat pain or discomfort. Recommend a diet with soft foods to include soups, broths, puddings, yogurt, Jell-O's, or popsicles until symptoms improve. Follow-up if symptoms do not improve.       ED Prescriptions     Medication Sig Dispense Auth. Provider   lidocaine (XYLOCAINE) 2 % solution Use as directed 5 mLs in the mouth or throat as needed for mouth pain. Gargle and spit 73mL  as needed for throat pain. 75 mL Vayla Wilhelmi-Warren, Sadie Haber, NP      PDMP not reviewed this encounter.   Abran Cantor, NP 11/03/21 1100

## 2021-11-03 NOTE — ED Triage Notes (Signed)
Pt states since Sunday he started feeling bad with a sore throat   Pt states that when he woke up yesterday he was stopped up and running a fever

## 2021-11-03 NOTE — Discharge Instructions (Addendum)
Rapid strep test is negative, COVID/flu and throat culture are pending.  Take medication as prescribed. Increase fluids and allow for plenty of rest. Recommend Tylenol or ibuprofen as needed for pain, fever, or general discomfort. Recommend throat lozenges, Chloraseptic or honey to help with throat pain. Warm salt water gargles 3-4 times daily to help with throat pain or discomfort. Recommend a diet with soft foods to include soups, broths, puddings, yogurt, Jell-O's, or popsicles until symptoms improve. Follow-up if symptoms do not improve.   

## 2021-11-04 DIAGNOSIS — F411 Generalized anxiety disorder: Secondary | ICD-10-CM | POA: Diagnosis not present

## 2021-11-04 DIAGNOSIS — F1120 Opioid dependence, uncomplicated: Secondary | ICD-10-CM | POA: Diagnosis not present

## 2021-11-04 DIAGNOSIS — Z6829 Body mass index (BMI) 29.0-29.9, adult: Secondary | ICD-10-CM | POA: Diagnosis not present

## 2021-11-04 DIAGNOSIS — F330 Major depressive disorder, recurrent, mild: Secondary | ICD-10-CM | POA: Diagnosis not present

## 2021-11-04 LAB — COVID-19, FLU A+B NAA
Influenza A, NAA: NOT DETECTED
Influenza B, NAA: NOT DETECTED
SARS-CoV-2, NAA: NOT DETECTED

## 2021-11-06 LAB — CULTURE, GROUP A STREP (THRC)

## 2021-11-18 DIAGNOSIS — Z79899 Other long term (current) drug therapy: Secondary | ICD-10-CM | POA: Diagnosis not present

## 2021-11-18 DIAGNOSIS — Z6829 Body mass index (BMI) 29.0-29.9, adult: Secondary | ICD-10-CM | POA: Diagnosis not present

## 2021-11-18 DIAGNOSIS — F1120 Opioid dependence, uncomplicated: Secondary | ICD-10-CM | POA: Diagnosis not present

## 2021-11-18 DIAGNOSIS — F411 Generalized anxiety disorder: Secondary | ICD-10-CM | POA: Diagnosis not present

## 2021-11-18 DIAGNOSIS — F330 Major depressive disorder, recurrent, mild: Secondary | ICD-10-CM | POA: Diagnosis not present

## 2021-12-09 DIAGNOSIS — F411 Generalized anxiety disorder: Secondary | ICD-10-CM | POA: Diagnosis not present

## 2021-12-09 DIAGNOSIS — Z6829 Body mass index (BMI) 29.0-29.9, adult: Secondary | ICD-10-CM | POA: Diagnosis not present

## 2021-12-09 DIAGNOSIS — F330 Major depressive disorder, recurrent, mild: Secondary | ICD-10-CM | POA: Diagnosis not present

## 2021-12-09 DIAGNOSIS — F1120 Opioid dependence, uncomplicated: Secondary | ICD-10-CM | POA: Diagnosis not present

## 2021-12-23 DIAGNOSIS — F411 Generalized anxiety disorder: Secondary | ICD-10-CM | POA: Diagnosis not present

## 2021-12-23 DIAGNOSIS — Z79899 Other long term (current) drug therapy: Secondary | ICD-10-CM | POA: Diagnosis not present

## 2021-12-23 DIAGNOSIS — F1120 Opioid dependence, uncomplicated: Secondary | ICD-10-CM | POA: Diagnosis not present

## 2021-12-23 DIAGNOSIS — F330 Major depressive disorder, recurrent, mild: Secondary | ICD-10-CM | POA: Diagnosis not present

## 2021-12-23 DIAGNOSIS — Z6830 Body mass index (BMI) 30.0-30.9, adult: Secondary | ICD-10-CM | POA: Diagnosis not present

## 2022-01-05 ENCOUNTER — Ambulatory Visit
Admission: EM | Admit: 2022-01-05 | Discharge: 2022-01-05 | Disposition: A | Discharge status: Home / Self Care | Payer: BC Managed Care – PPO | Attending: Family Medicine | Admitting: Family Medicine

## 2022-01-05 ENCOUNTER — Other Ambulatory Visit: Payer: Self-pay

## 2022-01-05 ENCOUNTER — Encounter: Payer: Self-pay | Admitting: Emergency Medicine

## 2022-01-05 DIAGNOSIS — R112 Nausea with vomiting, unspecified: Secondary | ICD-10-CM | POA: Diagnosis not present

## 2022-01-05 MED ORDER — ALUM & MAG HYDROXIDE-SIMETH 200-200-20 MG/5ML PO SUSP
30.0000 mL | Freq: Once | ORAL | Status: AC
  Administered 2022-01-05: 30 mL via ORAL

## 2022-01-05 MED ORDER — ONDANSETRON 4 MG PO TBDP: 4.0000 mg | ORAL_TABLET | Freq: Three times a day (TID) | ORAL | 0 refills | Status: AC | PRN

## 2022-01-05 NOTE — ED Triage Notes (Signed)
Pt reports emesis, abd pain since 1 am this am. Pt reports generalized abd pain, denies any known fevers but reports "I cant seem to get warm." Pt color noted to by mildly pale. Last emesis 10 am today.

## 2022-01-05 NOTE — ED Notes (Signed)
Pt given ice water per PA order for PO challenge post GI cocktail admin.

## 2022-01-05 NOTE — ED Provider Notes (Signed)
[facility]    CSN: [id number] Arrival date & time: 01/05/22  1303      History   Chief Complaint Chief Complaint  Patient presents with   Emesis    HPI Scott Maxwell is a 31 y.o. male.   Patient presenting today with nausea, vomiting, "sour stomach" since 1 AM this morning.  He has had chills off and on as well but no appreciable fever, body aches, cough, congestion, sore throat, diarrhea, constipation.  States no significant abdominal pain.  No known sick contacts recently, trying Pepto-Bismol with minimal relief.  Has not vomited since about 10 AM.  Ate some hot pockets very late last night but states this is not altogether abnormal for him.  No chronic GI issues.    History reviewed. No pertinent past medical history.  There are no problems to display for this patient.   History reviewed. No pertinent surgical history.     Home Medications    Prior to Admission medications   Medication Sig Start Date End Date Taking? Authorizing Provider  ondansetron (ZOFRAN-ODT) 4 MG disintegrating tablet Take 1 tablet (4 mg total) by mouth every 8 (eight) hours as needed for nausea or vomiting. 01/05/22  Yes Particia Nearing, PA-C  buPROPion (WELLBUTRIN XL) 150 MG 24 hr tablet Take 150 mg by mouth daily. 09/25/21   [provider]  citalopram (CELEXA) 40 MG tablet Take 40 mg by mouth daily. 10/07/21   [provider]  lidocaine (XYLOCAINE) 2 % solution Use as directed 5 mLs in the mouth or throat as needed for mouth pain. Gargle and spit 66mL as needed for throat pain. 11/03/21   Leath-Warren, Sadie Haber, NP  venlafaxine XR (EFFEXOR-XR) 37.5 MG 24 hr capsule Take 37.5 mg by mouth daily. 06/05/21   [provider]    Family History Family History  Problem Relation Age of Onset   Anxiety disorder Mother     Social History Social History   Tobacco Use   Smoking status: Never    Passive exposure: Never   Smokeless tobacco: Never   Vaping Use   Vaping Use: Every day  Substance Use Topics   Alcohol use: Not Currently   Drug use: Not Currently     Allergies   Sulfa antibiotics   Review of Systems Review of Systems Per HPI  Physical Exam Triage Vital Signs ED Triage Vitals  Enc Vitals Group     BP 01/05/22 1323 118/72     Pulse Rate 01/05/22 1323 79     Resp 01/05/22 1323 16     Temp 01/05/22 1323 97.9 F (36.6 C)     Temp Source 01/05/22 1323 Oral     SpO2 01/05/22 1323 93 %     Weight --      Height --      Head Circumference --      Peak Flow --      Pain Score 01/05/22 1332 7     Pain Loc --      Pain Edu? --      Excl. in GC? --    No data found.  Updated Vital Signs BP 118/72 (BP Location: Right Arm)   Pulse 79   Temp 97.9 F (36.6 C) (Oral)   Resp 16   SpO2 93%   Visual Acuity Right Eye Distance:   Left Eye Distance:   Bilateral Distance:    Right Eye Near:   Left Eye Near:    Bilateral  Near:     Physical Exam Vitals and nursing note reviewed.  Constitutional:      Appearance: Normal appearance.  HENT:     Head: Atraumatic.     Mouth/Throat:     Mouth: Mucous membranes are moist.  Eyes:     Extraocular Movements: Extraocular movements intact.     Conjunctiva/sclera: Conjunctivae normal.  Cardiovascular:     Rate and Rhythm: Normal rate and regular rhythm.  Pulmonary:     Effort: Pulmonary effort is normal.     Breath sounds: Normal breath sounds.  Abdominal:     General: Bowel sounds are normal. There is no distension.     Palpations: Abdomen is soft.     Tenderness: There is no abdominal tenderness. There is no right CVA tenderness, left CVA tenderness or guarding.  Musculoskeletal:        General: Normal range of motion.     Cervical back: Normal range of motion and neck supple.  Skin:    General: Skin is warm and dry.  Neurological:     General: No focal deficit present.     Mental Status: He is oriented to person, place, and time.  Psychiatric:         Mood and Affect: Mood normal.        Thought Content: Thought content normal.        Judgment: Judgment normal.      UC Treatments / Results  Labs (all labs ordered are listed, but only abnormal results are displayed) Labs Reviewed - No data to display  EKG   Radiology No results found.  Procedures Procedures (including critical care time)  Medications Ordered in UC Medications  alum & mag hydroxide-simeth (MAALOX/MYLANTA) 200-200-20 MG/5ML suspension 30 mL (30 mLs Oral Given 01/05/22 1347)    Initial Impression / Assessment and Plan / UC Course  I have reviewed the triage vital signs and the nursing notes.  Pertinent labs & imaging results that were available during my care of the patient were reviewed by me and considered in my medical decision making (see chart for details).     Suspect viral GI illness. GI cocktail administered in clinic with mild relief.  He was able to tolerate a glass of water with no subsequent vomiting.  He states improvement in his symptoms after GI cocktail.  Treat with Zofran as needed, brat diet, fluids.  Work note given.  Return for worsening symptoms.    Final Clinical Impressions(s) / UC Diagnoses   Final diagnoses:  Nausea and vomiting, unspecified vomiting type   Discharge Instructions   None    ED Prescriptions     Medication Sig Dispense Auth. Provider   ondansetron (ZOFRAN-ODT) 4 MG disintegrating tablet Take 1 tablet (4 mg total) by mouth every 8 (eight) hours as needed for nausea or vomiting. 20 tablet Particia Nearing, [place]      PDMP not reviewed this encounter.   Particia Nearing, [place] 01/05/22 1431

## 2022-01-13 DIAGNOSIS — F330 Major depressive disorder, recurrent, mild: Secondary | ICD-10-CM | POA: Diagnosis not present

## 2022-01-13 DIAGNOSIS — F411 Generalized anxiety disorder: Secondary | ICD-10-CM | POA: Diagnosis not present

## 2022-01-13 DIAGNOSIS — F1120 Opioid dependence, uncomplicated: Secondary | ICD-10-CM | POA: Diagnosis not present

## 2022-01-13 DIAGNOSIS — Z6830 Body mass index (BMI) 30.0-30.9, adult: Secondary | ICD-10-CM | POA: Diagnosis not present

## 2022-02-03 DIAGNOSIS — Z79899 Other long term (current) drug therapy: Secondary | ICD-10-CM | POA: Diagnosis not present

## 2022-02-03 DIAGNOSIS — F330 Major depressive disorder, recurrent, mild: Secondary | ICD-10-CM | POA: Diagnosis not present

## 2022-02-03 DIAGNOSIS — F411 Generalized anxiety disorder: Secondary | ICD-10-CM | POA: Diagnosis not present

## 2022-02-03 DIAGNOSIS — F1120 Opioid dependence, uncomplicated: Secondary | ICD-10-CM | POA: Diagnosis not present

## 2022-02-03 DIAGNOSIS — Z6830 Body mass index (BMI) 30.0-30.9, adult: Secondary | ICD-10-CM | POA: Diagnosis not present

## 2022-02-24 DIAGNOSIS — F1120 Opioid dependence, uncomplicated: Secondary | ICD-10-CM | POA: Diagnosis not present

## 2022-02-24 DIAGNOSIS — Z6830 Body mass index (BMI) 30.0-30.9, adult: Secondary | ICD-10-CM | POA: Diagnosis not present

## 2022-02-24 DIAGNOSIS — F411 Generalized anxiety disorder: Secondary | ICD-10-CM | POA: Diagnosis not present

## 2022-02-24 DIAGNOSIS — F330 Major depressive disorder, recurrent, mild: Secondary | ICD-10-CM | POA: Diagnosis not present

## 2022-03-31 DIAGNOSIS — Z79899 Other long term (current) drug therapy: Secondary | ICD-10-CM | POA: Diagnosis not present

## 2022-03-31 DIAGNOSIS — F411 Generalized anxiety disorder: Secondary | ICD-10-CM | POA: Diagnosis not present

## 2022-03-31 DIAGNOSIS — Z6830 Body mass index (BMI) 30.0-30.9, adult: Secondary | ICD-10-CM | POA: Diagnosis not present

## 2022-03-31 DIAGNOSIS — F330 Major depressive disorder, recurrent, mild: Secondary | ICD-10-CM | POA: Diagnosis not present

## 2022-03-31 DIAGNOSIS — F1120 Opioid dependence, uncomplicated: Secondary | ICD-10-CM | POA: Diagnosis not present

## 2022-04-15 DIAGNOSIS — Z6829 Body mass index (BMI) 29.0-29.9, adult: Secondary | ICD-10-CM | POA: Diagnosis not present

## 2022-04-15 DIAGNOSIS — F1121 Opioid dependence, in remission: Secondary | ICD-10-CM | POA: Diagnosis not present

## 2022-04-15 DIAGNOSIS — F332 Major depressive disorder, recurrent severe without psychotic features: Secondary | ICD-10-CM | POA: Diagnosis not present

## 2022-04-28 DIAGNOSIS — F411 Generalized anxiety disorder: Secondary | ICD-10-CM | POA: Diagnosis not present

## 2022-04-28 DIAGNOSIS — F1120 Opioid dependence, uncomplicated: Secondary | ICD-10-CM | POA: Diagnosis not present

## 2022-04-28 DIAGNOSIS — F330 Major depressive disorder, recurrent, mild: Secondary | ICD-10-CM | POA: Diagnosis not present

## 2022-04-28 DIAGNOSIS — Z6830 Body mass index (BMI) 30.0-30.9, adult: Secondary | ICD-10-CM | POA: Diagnosis not present

## 2022-05-09 ENCOUNTER — Ambulatory Visit
Admission: EM | Admit: 2022-05-09 | Discharge: 2022-05-09 | Disposition: A | Discharge status: Home / Self Care | Payer: BC Managed Care – PPO | Attending: Nurse Practitioner | Admitting: Nurse Practitioner

## 2022-05-09 ENCOUNTER — Encounter: Payer: Self-pay | Admitting: Emergency Medicine

## 2022-05-09 ENCOUNTER — Ambulatory Visit (INDEPENDENT_AMBULATORY_CARE_PROVIDER_SITE_OTHER): Payer: BC Managed Care – PPO

## 2022-05-09 DIAGNOSIS — R319 Hematuria, unspecified: Secondary | ICD-10-CM

## 2022-05-09 DIAGNOSIS — R109 Unspecified abdominal pain: Secondary | ICD-10-CM

## 2022-05-09 DIAGNOSIS — N200 Calculus of kidney: Secondary | ICD-10-CM | POA: Insufficient documentation

## 2022-05-09 LAB — POCT URINALYSIS DIP (MANUAL ENTRY)
Glucose, UA: NEGATIVE mg/dL
Leukocytes, UA: NEGATIVE
Nitrite, UA: NEGATIVE
Protein Ur, POC: 100 mg/dL — AB
Spec Grav, UA: >=1.03 — AB (ref 1.010–1.025)
Urobilinogen, UA: 0.2 U/dL
pH, UA: 5.5 (ref 5.0–8.0)

## 2022-05-09 MED ORDER — IBUPROFEN 800 MG PO TABS: 800.0000 mg | ORAL_TABLET | Freq: Three times a day (TID) | ORAL | 0 refills | Status: AC

## 2022-05-09 NOTE — ED Provider Notes (Signed)
[facility]    CSN: [id number] Arrival date & time: 05/09/22  9326      History   Chief Complaint No chief complaint on file.   HPI Scott Maxwell is a 31 y.o. male.   The history is provided by the patient.   Patient presents for complaints of blood in his urine that started over the past 24 hours.  Patient states prior to noticing the blood, he developed a sharp pain in his left side/flank.  He states the pain radiated into the groin region.  He states that when he urinated he had about "70/30" percent urine and blood, 30% of his urine containing blood.  He states this morning, he also had an episode of blood in his urine.  He states when the pain started, it was approximately 6 out of 10, now he states it is a 4 out of a 10.  He denies previous history of kidney stones.  He reports he is sexually active with 1 male partner.  Patient further denies dysuria, frequency, urgency, hesitancy, or decreased urine stream.  Past Medical History:  Diagnosis Date   Anxiety     There are no problems to display for this patient.   History reviewed. No pertinent surgical history.     Home Medications    Prior to Admission medications   Medication Sig Start Date End Date Taking? Authorizing Provider  buprenorphine-naloxone (SUBOXONE) 8-2 mg SUBL SL tablet Place 1 tablet under the tongue daily.   Yes [provider]  ibuprofen (ADVIL) 800 MG tablet Take 1 tablet (800 mg total) by mouth 3 (three) times daily. 05/09/22  Yes Xochitl Egle-Warren, Sadie Haber, NP  buPROPion (WELLBUTRIN XL) 150 MG 24 hr tablet Take 150 mg by mouth daily. 09/25/21   [provider]  citalopram (CELEXA) 40 MG tablet Take 40 mg by mouth daily. 10/07/21   [provider]  lidocaine (XYLOCAINE) 2 % solution Use as directed 5 mLs in the mouth or throat as needed for mouth pain. Gargle and spit 48mL as needed for throat pain. 11/03/21   Eda Magnussen-Warren, Sadie Haber, NP  ondansetron  (ZOFRAN-ODT) 4 MG disintegrating tablet Take 1 tablet (4 mg total) by mouth every 8 (eight) hours as needed for nausea or vomiting. 01/05/22   Particia Nearing, PA-C  venlafaxine XR (EFFEXOR-XR) 37.5 MG 24 hr capsule Take 37.5 mg by mouth daily. 06/05/21   [provider]    Family History Family History  Problem Relation Age of Onset   Anxiety disorder Mother     Social History Social History   Tobacco Use   Smoking status: Never    Passive exposure: Never   Smokeless tobacco: Never  Vaping Use   Vaping Use: Every day   Substances: Nicotine, CBD  Substance Use Topics   Alcohol use: Not Currently   Drug use: Not Currently     Allergies   Sulfa antibiotics   Review of Systems Review of Systems Per HPI  Physical Exam Triage Vital Signs ED Triage Vitals  Enc Vitals Group     BP 05/09/22 0917 (!) 141/77     Pulse Rate 05/09/22 0917 (!) 101     Resp 05/09/22 0917 18     Temp 05/09/22 0917 98.1 F (36.7 C)     Temp Source 05/09/22 0917 Oral     SpO2 05/09/22 0917 97 %     Weight --      Height --  Head Circumference --      Peak Flow --      Pain Score 05/09/22 0918 0     Pain Loc --      Pain Edu? --      Excl. in GC? --    No data found.  Updated Vital Signs BP (!) 141/77 (BP Location: Right Arm)   Pulse (!) 101   Temp 98.1 F (36.7 C) (Oral)   Resp 18   SpO2 97%   Visual Acuity Right Eye Distance:   Left Eye Distance:   Bilateral Distance:    Right Eye Near:   Left Eye Near:    Bilateral Near:     Physical Exam Vitals and nursing note reviewed.  Constitutional:      General: He is not in acute distress.    Appearance: Normal appearance.  HENT:     Head: Normocephalic.  Eyes:     Pupils: Pupils are equal, round, and reactive to light.  Cardiovascular:     Rate and Rhythm: Normal rate and regular rhythm.     Pulses: Normal pulses.     Heart sounds: Normal heart sounds.  Pulmonary:     Effort: Pulmonary effort is normal.  No respiratory distress.     Breath sounds: Normal breath sounds. No stridor. No wheezing, rhonchi or rales.  Abdominal:     General: Bowel sounds are normal.     Palpations: Abdomen is soft.     Tenderness: There is no abdominal tenderness. There is no right CVA tenderness or left CVA tenderness.  Musculoskeletal:     Cervical back: Normal range of motion.  Lymphadenopathy:     Cervical: No cervical adenopathy.  Skin:    General: Skin is warm and dry.  Neurological:     General: No focal deficit present.     Mental Status: He is alert and oriented to person, place, and time.  Psychiatric:        Mood and Affect: Mood normal.        Behavior: Behavior normal.      UC Treatments / Results  Labs (all labs ordered are listed, but only abnormal results are displayed) Labs Reviewed  POCT URINALYSIS DIP (MANUAL ENTRY) - Abnormal; Notable for the following components:      Result Value   Color, UA brown (*)    Clarity, UA cloudy (*)    Bilirubin, UA small (*)    Ketones, POC UA trace (5) (*)    Spec Grav, UA >=1.030 (*)    Blood, UA large (*)    Protein Ur, POC =100 (*)    All other components within normal limits  URINE CULTURE    EKG   Radiology DG Abd 1 View  Result Date: 05/09/2022 CLINICAL DATA:  hematuria, right-sided abdominal pain EXAM: ABDOMEN - 1 VIEW COMPARISON:  L-spine XRs, 04/08/2021. FINDINGS: The bowel gas pattern is normal. Question punctate radiodensity projecting at the RIGHT superior renal collecting system. No additional radio-opaque calculi or other significant radiographic abnormality are seen. IMPRESSION: Question punctate radiodensity projecting at the RIGHT superior renal collecting system. No additional radio-opaque renal calculus. If continued clinical concern, consider noncontrast CT AP for further evaluation. Electronically Signed   By: Roanna Banning M.D.   On: 05/09/2022 10:05    Procedures Procedures (including critical care  time)  Medications Ordered in UC Medications - No data to display  Initial Impression / Assessment and Plan / UC Course  I have reviewed the  triage vital signs and the nursing notes.  Pertinent labs & imaging results that were available during my care of the patient were reviewed by me and considered in my medical decision making (see chart for details).  The patient is well-appearing, he is in no acute distress, vital signs are stable.  Patient presents with gross hematuria that been present over the past 24 hours.  Urinalysis correlates the same.  X-ray was done that showed a possible punctate radiodensity over the right superior renal collecting system, consistent with a possible kidney stone.  Patient is allergic to sulfa, therefore cannot prescribe tamsulosin.  In the interim, will prescribe ibuprofen 800 mg for pain control along with having patient straining his urine, increasing his fluid intake, and following up with urology.  Patient was given the information for Dr. Ronne Binning.  Patient was given strict return precautions and ER precautions.  Patient verbalizes understanding.  All questions were answered.  Patient is stable for discharge. Final Clinical Impressions(s) / UC Diagnoses   Final diagnoses:  Hematuria, unspecified type  Kidney stone     Discharge Instructions      The x-rays suggest that you may have passed a kidney stone.  I am providing a strainer for you to continue straining your urine with each void. Take medication as prescribed for pain control. Try to drink at least 10-12 8 ounce glasses of water while symptoms persist. If you begin to experience pain with urination, urinary frequency, urgency, hesitancy, or other concerns, please follow-up in this clinic for further evaluation. I recommend following up with a urologist for reevaluation.  You can follow-up with Dr. Wilkie Aye at [phone].  Try to schedule a follow-up within the next 5 to 7 days or as  soon as possible.  If your symptoms continue to persist, please go to the emergency department.     ED Prescriptions     Medication Sig Dispense Auth. Provider   ibuprofen (ADVIL) 800 MG tablet Take 1 tablet (800 mg total) by mouth 3 (three) times daily. 21 tablet Jihaad Bruschi-Warren, Sadie Haber, NP      PDMP not reviewed this encounter.   Abran Cantor, NP 05/09/22 1020

## 2022-05-09 NOTE — ED Triage Notes (Signed)
Right flank pain since yesterday.  States urine is dark in color.

## 2022-05-09 NOTE — Discharge Instructions (Addendum)
The x-rays suggest that you may have passed a kidney stone.  I am providing a strainer for you to continue straining your urine with each void. Take medication as prescribed for pain control. Try to drink at least 10-12 8 ounce glasses of water while symptoms persist. If you begin to experience pain with urination, urinary frequency, urgency, hesitancy, or other concerns, please follow-up in this clinic for further evaluation. I recommend following up with a urologist for reevaluation.  You can follow-up with Dr. Wilkie Aye at [phone].  Try to schedule a follow-up within the next 5 to 7 days or as soon as possible.  If your symptoms continue to persist, please go to the emergency department.

## 2022-05-10 LAB — URINE CULTURE: Culture: NO GROWTH

## 2022-05-19 DIAGNOSIS — F1120 Opioid dependence, uncomplicated: Secondary | ICD-10-CM | POA: Diagnosis not present

## 2022-05-19 DIAGNOSIS — Z6830 Body mass index (BMI) 30.0-30.9, adult: Secondary | ICD-10-CM | POA: Diagnosis not present

## 2022-05-19 DIAGNOSIS — F330 Major depressive disorder, recurrent, mild: Secondary | ICD-10-CM | POA: Diagnosis not present

## 2022-05-19 DIAGNOSIS — F411 Generalized anxiety disorder: Secondary | ICD-10-CM | POA: Diagnosis not present

## 2022-05-19 DIAGNOSIS — Z79899 Other long term (current) drug therapy: Secondary | ICD-10-CM | POA: Diagnosis not present

## 2022-06-09 DIAGNOSIS — Z6830 Body mass index (BMI) 30.0-30.9, adult: Secondary | ICD-10-CM | POA: Diagnosis not present

## 2022-06-09 DIAGNOSIS — F1120 Opioid dependence, uncomplicated: Secondary | ICD-10-CM | POA: Diagnosis not present

## 2022-06-09 DIAGNOSIS — F330 Major depressive disorder, recurrent, mild: Secondary | ICD-10-CM | POA: Diagnosis not present

## 2022-06-09 DIAGNOSIS — F411 Generalized anxiety disorder: Secondary | ICD-10-CM | POA: Diagnosis not present

## 2022-06-30 DIAGNOSIS — Z79899 Other long term (current) drug therapy: Secondary | ICD-10-CM | POA: Diagnosis not present

## 2022-06-30 DIAGNOSIS — F1120 Opioid dependence, uncomplicated: Secondary | ICD-10-CM | POA: Diagnosis not present

## 2022-06-30 DIAGNOSIS — Z6830 Body mass index (BMI) 30.0-30.9, adult: Secondary | ICD-10-CM | POA: Diagnosis not present

## 2022-06-30 DIAGNOSIS — F411 Generalized anxiety disorder: Secondary | ICD-10-CM | POA: Diagnosis not present

## 2022-06-30 DIAGNOSIS — F330 Major depressive disorder, recurrent, mild: Secondary | ICD-10-CM | POA: Diagnosis not present

## 2022-07-07 DIAGNOSIS — F332 Major depressive disorder, recurrent severe without psychotic features: Secondary | ICD-10-CM | POA: Diagnosis not present

## 2022-07-07 DIAGNOSIS — E663 Overweight: Secondary | ICD-10-CM | POA: Diagnosis not present

## 2022-07-07 DIAGNOSIS — F1121 Opioid dependence, in remission: Secondary | ICD-10-CM | POA: Diagnosis not present

## 2022-07-07 DIAGNOSIS — Z6829 Body mass index (BMI) 29.0-29.9, adult: Secondary | ICD-10-CM | POA: Diagnosis not present

## 2022-07-21 DIAGNOSIS — F330 Major depressive disorder, recurrent, mild: Secondary | ICD-10-CM | POA: Diagnosis not present

## 2022-07-21 DIAGNOSIS — F411 Generalized anxiety disorder: Secondary | ICD-10-CM | POA: Diagnosis not present

## 2022-07-21 DIAGNOSIS — F1120 Opioid dependence, uncomplicated: Secondary | ICD-10-CM | POA: Diagnosis not present

## 2022-07-21 DIAGNOSIS — Z6830 Body mass index (BMI) 30.0-30.9, adult: Secondary | ICD-10-CM | POA: Diagnosis not present

## 2022-08-11 DIAGNOSIS — F1120 Opioid dependence, uncomplicated: Secondary | ICD-10-CM | POA: Diagnosis not present

## 2022-08-11 DIAGNOSIS — F411 Generalized anxiety disorder: Secondary | ICD-10-CM | POA: Diagnosis not present

## 2022-08-11 DIAGNOSIS — F330 Major depressive disorder, recurrent, mild: Secondary | ICD-10-CM | POA: Diagnosis not present

## 2022-08-11 DIAGNOSIS — Z6830 Body mass index (BMI) 30.0-30.9, adult: Secondary | ICD-10-CM | POA: Diagnosis not present

## 2022-08-25 DIAGNOSIS — F330 Major depressive disorder, recurrent, mild: Secondary | ICD-10-CM | POA: Diagnosis not present

## 2022-08-25 DIAGNOSIS — Z6830 Body mass index (BMI) 30.0-30.9, adult: Secondary | ICD-10-CM | POA: Diagnosis not present

## 2022-08-25 DIAGNOSIS — F411 Generalized anxiety disorder: Secondary | ICD-10-CM | POA: Diagnosis not present

## 2022-08-25 DIAGNOSIS — F1120 Opioid dependence, uncomplicated: Secondary | ICD-10-CM | POA: Diagnosis not present

## 2022-08-25 DIAGNOSIS — Z79899 Other long term (current) drug therapy: Secondary | ICD-10-CM | POA: Diagnosis not present

## 2022-09-13 DIAGNOSIS — Z6830 Body mass index (BMI) 30.0-30.9, adult: Secondary | ICD-10-CM | POA: Diagnosis not present

## 2022-09-13 DIAGNOSIS — Z131 Encounter for screening for diabetes mellitus: Secondary | ICD-10-CM | POA: Diagnosis not present

## 2022-09-13 DIAGNOSIS — Z0000 Encounter for general adult medical examination without abnormal findings: Secondary | ICD-10-CM | POA: Diagnosis not present

## 2022-09-13 DIAGNOSIS — E669 Obesity, unspecified: Secondary | ICD-10-CM | POA: Diagnosis not present

## 2022-09-15 DIAGNOSIS — F330 Major depressive disorder, recurrent, mild: Secondary | ICD-10-CM | POA: Diagnosis not present

## 2022-09-15 DIAGNOSIS — F411 Generalized anxiety disorder: Secondary | ICD-10-CM | POA: Diagnosis not present

## 2022-09-15 DIAGNOSIS — Z6830 Body mass index (BMI) 30.0-30.9, adult: Secondary | ICD-10-CM | POA: Diagnosis not present

## 2022-09-15 DIAGNOSIS — F1120 Opioid dependence, uncomplicated: Secondary | ICD-10-CM | POA: Diagnosis not present

## 2022-09-29 DIAGNOSIS — Z79899 Other long term (current) drug therapy: Secondary | ICD-10-CM | POA: Diagnosis not present

## 2022-09-29 DIAGNOSIS — F1120 Opioid dependence, uncomplicated: Secondary | ICD-10-CM | POA: Diagnosis not present

## 2022-09-29 DIAGNOSIS — F411 Generalized anxiety disorder: Secondary | ICD-10-CM | POA: Diagnosis not present

## 2022-09-29 DIAGNOSIS — F330 Major depressive disorder, recurrent, mild: Secondary | ICD-10-CM | POA: Diagnosis not present

## 2022-09-29 DIAGNOSIS — Z6831 Body mass index (BMI) 31.0-31.9, adult: Secondary | ICD-10-CM | POA: Diagnosis not present

## 2022-10-20 DIAGNOSIS — F1120 Opioid dependence, uncomplicated: Secondary | ICD-10-CM | POA: Diagnosis not present

## 2022-10-20 DIAGNOSIS — F411 Generalized anxiety disorder: Secondary | ICD-10-CM | POA: Diagnosis not present

## 2022-10-20 DIAGNOSIS — F330 Major depressive disorder, recurrent, mild: Secondary | ICD-10-CM | POA: Diagnosis not present

## 2022-11-11 DIAGNOSIS — F330 Major depressive disorder, recurrent, mild: Secondary | ICD-10-CM | POA: Diagnosis not present

## 2022-11-11 DIAGNOSIS — Z6832 Body mass index (BMI) 32.0-32.9, adult: Secondary | ICD-10-CM | POA: Diagnosis not present

## 2022-11-11 DIAGNOSIS — F411 Generalized anxiety disorder: Secondary | ICD-10-CM | POA: Diagnosis not present

## 2022-11-11 DIAGNOSIS — Z79899 Other long term (current) drug therapy: Secondary | ICD-10-CM | POA: Diagnosis not present

## 2022-11-11 DIAGNOSIS — F1120 Opioid dependence, uncomplicated: Secondary | ICD-10-CM | POA: Diagnosis not present

## 2022-12-09 DIAGNOSIS — F330 Major depressive disorder, recurrent, mild: Secondary | ICD-10-CM | POA: Diagnosis not present

## 2022-12-09 DIAGNOSIS — F1120 Opioid dependence, uncomplicated: Secondary | ICD-10-CM | POA: Diagnosis not present

## 2022-12-09 DIAGNOSIS — F411 Generalized anxiety disorder: Secondary | ICD-10-CM | POA: Diagnosis not present

## 2023-01-06 DIAGNOSIS — F330 Major depressive disorder, recurrent, mild: Secondary | ICD-10-CM | POA: Diagnosis not present

## 2023-01-06 DIAGNOSIS — F1120 Opioid dependence, uncomplicated: Secondary | ICD-10-CM | POA: Diagnosis not present

## 2023-01-06 DIAGNOSIS — F411 Generalized anxiety disorder: Secondary | ICD-10-CM | POA: Diagnosis not present

## 2023-02-03 DIAGNOSIS — Z79899 Other long term (current) drug therapy: Secondary | ICD-10-CM | POA: Diagnosis not present

## 2023-02-03 DIAGNOSIS — F1120 Opioid dependence, uncomplicated: Secondary | ICD-10-CM | POA: Diagnosis not present

## 2023-02-03 DIAGNOSIS — Z6832 Body mass index (BMI) 32.0-32.9, adult: Secondary | ICD-10-CM | POA: Diagnosis not present

## 2023-02-03 DIAGNOSIS — F411 Generalized anxiety disorder: Secondary | ICD-10-CM | POA: Diagnosis not present

## 2023-02-03 DIAGNOSIS — F330 Major depressive disorder, recurrent, mild: Secondary | ICD-10-CM | POA: Diagnosis not present

## 2023-03-03 DIAGNOSIS — F1120 Opioid dependence, uncomplicated: Secondary | ICD-10-CM | POA: Diagnosis not present

## 2023-03-03 DIAGNOSIS — F330 Major depressive disorder, recurrent, mild: Secondary | ICD-10-CM | POA: Diagnosis not present

## 2023-03-03 DIAGNOSIS — F411 Generalized anxiety disorder: Secondary | ICD-10-CM | POA: Diagnosis not present

## 2023-03-18 IMAGING — CR DG LUMBAR SPINE COMPLETE 4+V
6 series · 6 of 6 positions shown · non-contrast
Comparison: None.

CLINICAL DATA: Back pain

EXAM:
LUMBAR SPINE - COMPLETE 4+ VIEW

[w lumbar spine ap (1 of 2)]
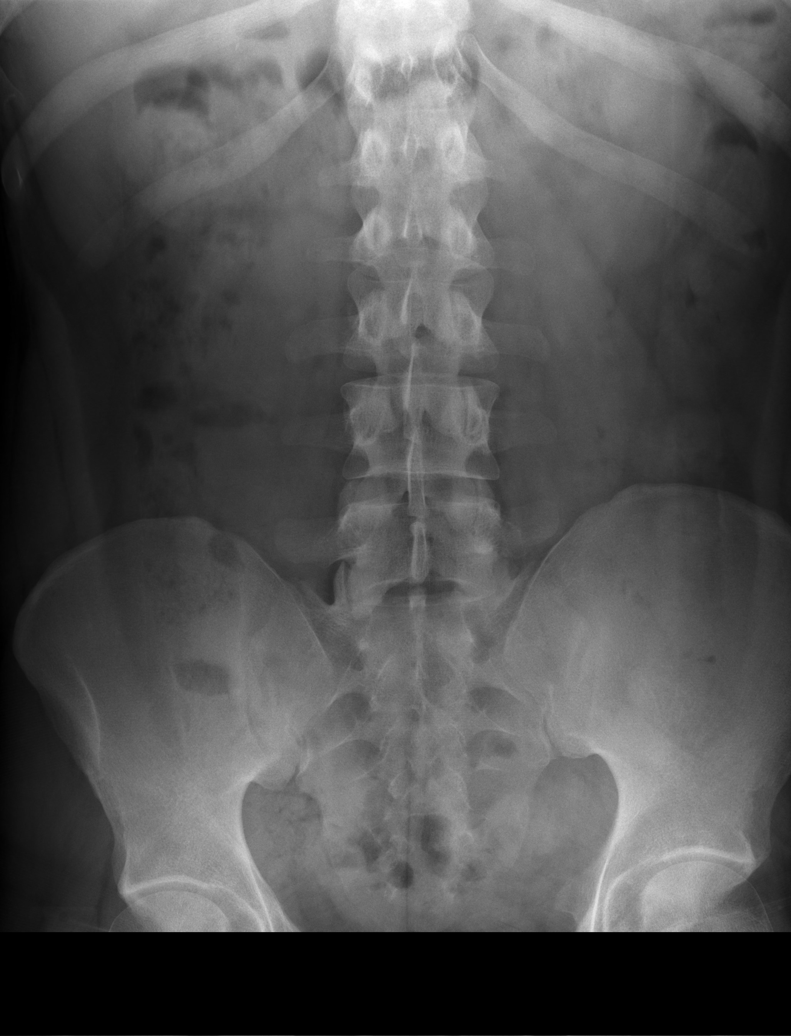

[w lumbar spine obl (1 of 2)]
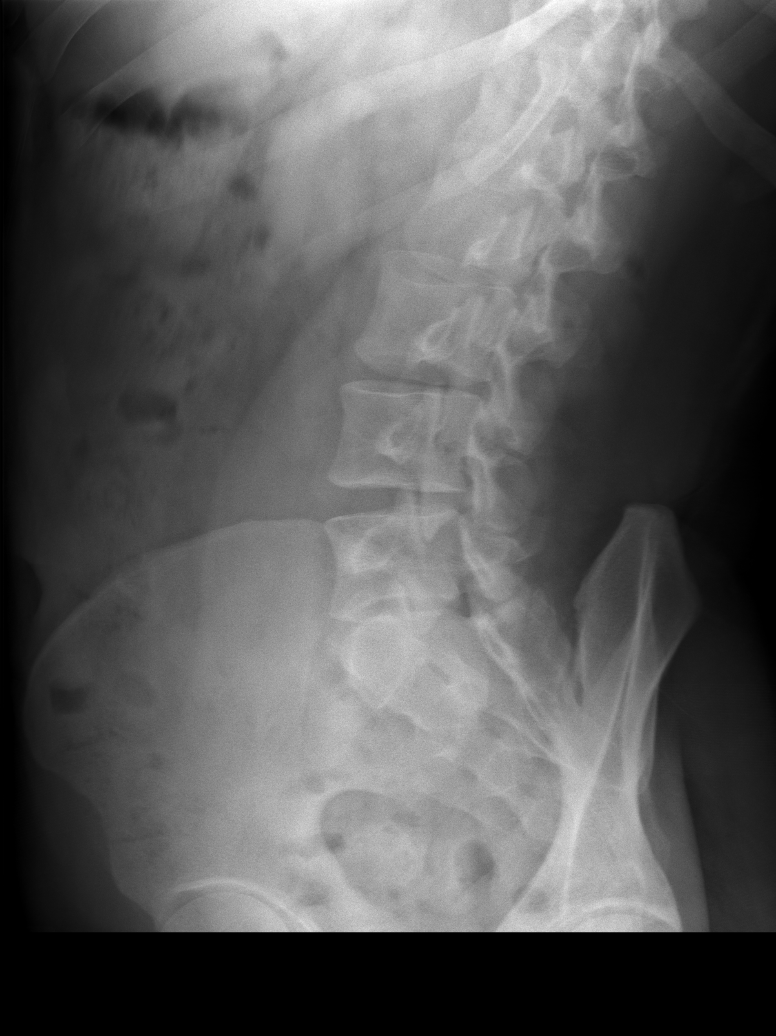

[w lumbar spine ap (2 of 2)]
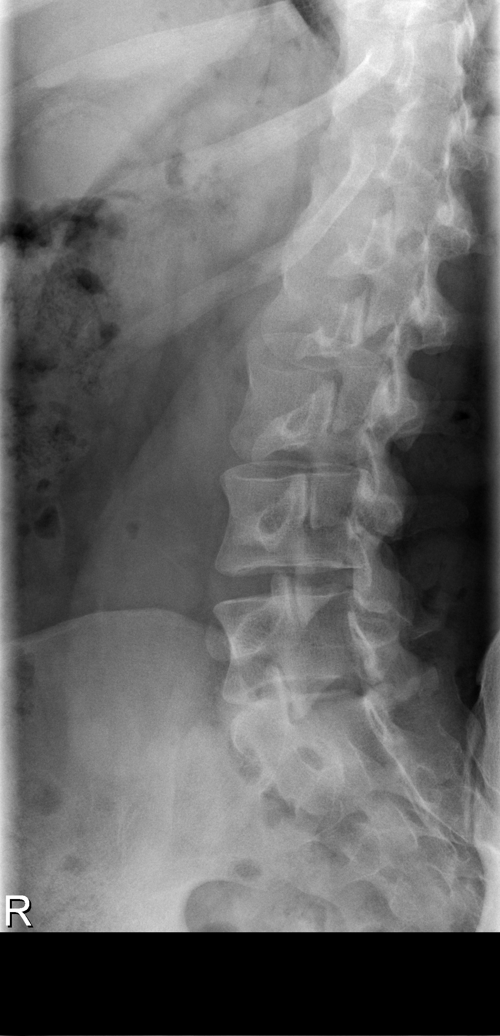

[w lumbar spine obl (2 of 2)]
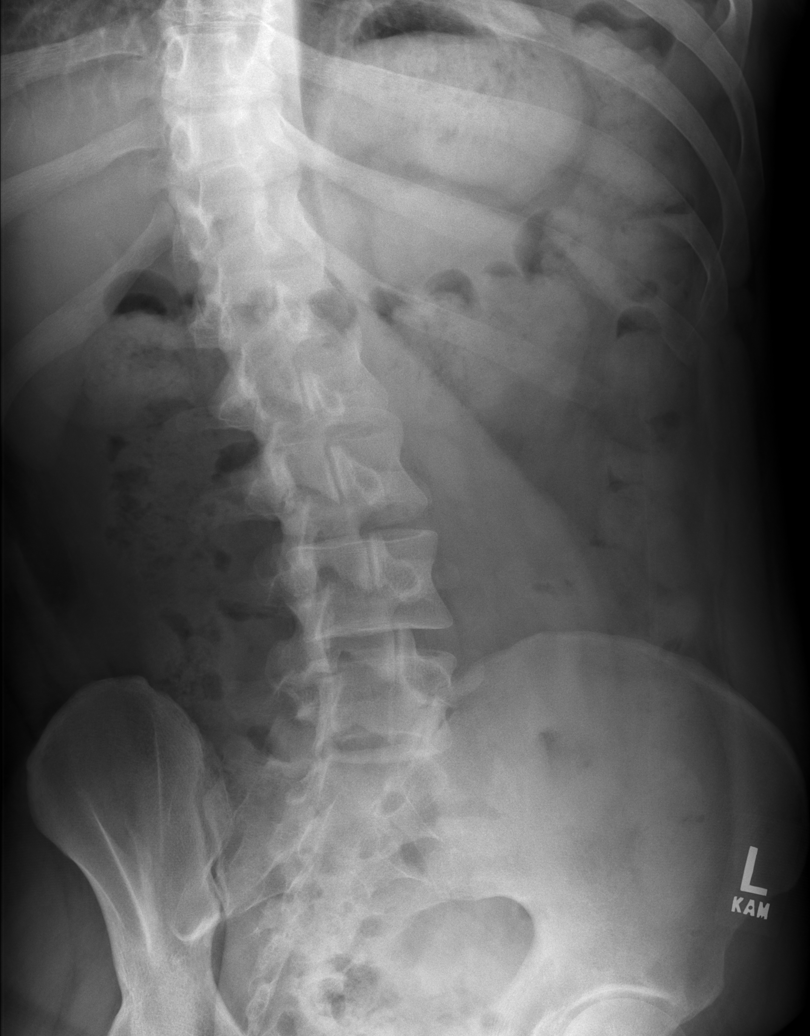

[w lumbar spine lat]
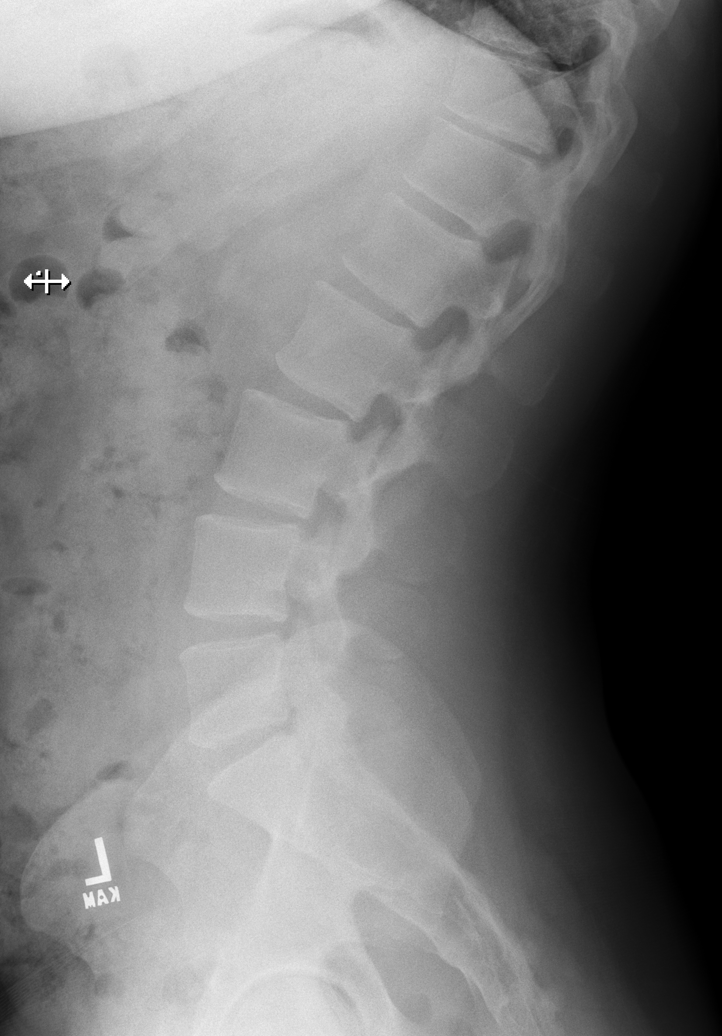

[w lumbar l-5 s-1 spot]
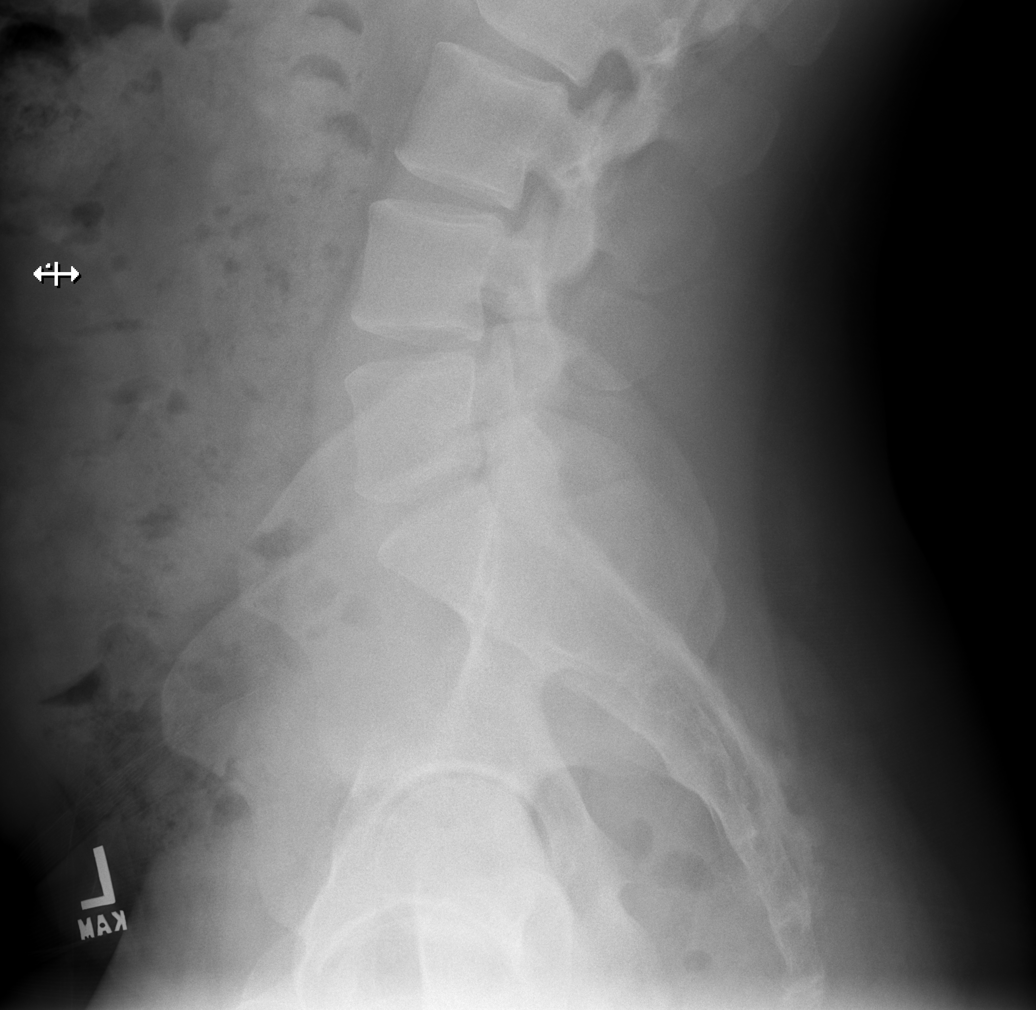

[6 of 6 positions shown; findings below may reference images not displayed]

FINDINGS: Lumbar vertebral body height and alignment are preserved without
fracture or spondylolisthesis. No spondylolysis identified.

Intervertebral disc spaces are preserved.
IMPRESSION: No acute osseous abnormality identified.

## 2023-03-31 DIAGNOSIS — Z79899 Other long term (current) drug therapy: Secondary | ICD-10-CM | POA: Diagnosis not present

## 2023-03-31 DIAGNOSIS — Z6832 Body mass index (BMI) 32.0-32.9, adult: Secondary | ICD-10-CM | POA: Diagnosis not present

## 2023-03-31 DIAGNOSIS — F330 Major depressive disorder, recurrent, mild: Secondary | ICD-10-CM | POA: Diagnosis not present

## 2023-03-31 DIAGNOSIS — F1120 Opioid dependence, uncomplicated: Secondary | ICD-10-CM | POA: Diagnosis not present

## 2023-03-31 DIAGNOSIS — F411 Generalized anxiety disorder: Secondary | ICD-10-CM | POA: Diagnosis not present

## 2023-05-11 DIAGNOSIS — F411 Generalized anxiety disorder: Secondary | ICD-10-CM | POA: Diagnosis not present

## 2023-05-11 DIAGNOSIS — F330 Major depressive disorder, recurrent, mild: Secondary | ICD-10-CM | POA: Diagnosis not present

## 2023-05-11 DIAGNOSIS — F1120 Opioid dependence, uncomplicated: Secondary | ICD-10-CM | POA: Diagnosis not present

## 2023-06-22 DIAGNOSIS — F1120 Opioid dependence, uncomplicated: Secondary | ICD-10-CM | POA: Diagnosis not present

## 2023-06-22 DIAGNOSIS — F411 Generalized anxiety disorder: Secondary | ICD-10-CM | POA: Diagnosis not present

## 2023-06-22 DIAGNOSIS — F330 Major depressive disorder, recurrent, mild: Secondary | ICD-10-CM | POA: Diagnosis not present

## 2023-07-20 DIAGNOSIS — F1120 Opioid dependence, uncomplicated: Secondary | ICD-10-CM | POA: Diagnosis not present

## 2023-07-20 DIAGNOSIS — F330 Major depressive disorder, recurrent, mild: Secondary | ICD-10-CM | POA: Diagnosis not present

## 2023-07-20 DIAGNOSIS — F411 Generalized anxiety disorder: Secondary | ICD-10-CM | POA: Diagnosis not present

## 2023-08-17 DIAGNOSIS — Z79899 Other long term (current) drug therapy: Secondary | ICD-10-CM | POA: Diagnosis not present

## 2023-08-17 DIAGNOSIS — F1120 Opioid dependence, uncomplicated: Secondary | ICD-10-CM | POA: Diagnosis not present

## 2023-08-17 DIAGNOSIS — Z6833 Body mass index (BMI) 33.0-33.9, adult: Secondary | ICD-10-CM | POA: Diagnosis not present

## 2023-08-17 DIAGNOSIS — F330 Major depressive disorder, recurrent, mild: Secondary | ICD-10-CM | POA: Diagnosis not present

## 2023-08-17 DIAGNOSIS — F411 Generalized anxiety disorder: Secondary | ICD-10-CM | POA: Diagnosis not present

## 2023-09-14 DIAGNOSIS — F330 Major depressive disorder, recurrent, mild: Secondary | ICD-10-CM | POA: Diagnosis not present

## 2023-09-14 DIAGNOSIS — F411 Generalized anxiety disorder: Secondary | ICD-10-CM | POA: Diagnosis not present

## 2023-09-14 DIAGNOSIS — F1120 Opioid dependence, uncomplicated: Secondary | ICD-10-CM | POA: Diagnosis not present

## 2023-10-12 DIAGNOSIS — F1120 Opioid dependence, uncomplicated: Secondary | ICD-10-CM | POA: Diagnosis not present

## 2023-10-12 DIAGNOSIS — Z6832 Body mass index (BMI) 32.0-32.9, adult: Secondary | ICD-10-CM | POA: Diagnosis not present

## 2023-10-12 DIAGNOSIS — Z79899 Other long term (current) drug therapy: Secondary | ICD-10-CM | POA: Diagnosis not present

## 2023-10-12 DIAGNOSIS — F411 Generalized anxiety disorder: Secondary | ICD-10-CM | POA: Diagnosis not present

## 2023-10-12 DIAGNOSIS — F330 Major depressive disorder, recurrent, mild: Secondary | ICD-10-CM | POA: Diagnosis not present

## 2023-10-20 DIAGNOSIS — Z6831 Body mass index (BMI) 31.0-31.9, adult: Secondary | ICD-10-CM | POA: Diagnosis not present

## 2023-10-20 DIAGNOSIS — Z13220 Encounter for screening for lipoid disorders: Secondary | ICD-10-CM | POA: Diagnosis not present

## 2023-10-20 DIAGNOSIS — Z0000 Encounter for general adult medical examination without abnormal findings: Secondary | ICD-10-CM | POA: Diagnosis not present

## 2023-10-20 DIAGNOSIS — Z131 Encounter for screening for diabetes mellitus: Secondary | ICD-10-CM | POA: Diagnosis not present

## 2023-10-20 DIAGNOSIS — E669 Obesity, unspecified: Secondary | ICD-10-CM | POA: Diagnosis not present

## 2023-11-09 DIAGNOSIS — F330 Major depressive disorder, recurrent, mild: Secondary | ICD-10-CM | POA: Diagnosis not present

## 2023-11-09 DIAGNOSIS — F411 Generalized anxiety disorder: Secondary | ICD-10-CM | POA: Diagnosis not present

## 2023-11-09 DIAGNOSIS — F1120 Opioid dependence, uncomplicated: Secondary | ICD-10-CM | POA: Diagnosis not present

## 2023-12-06 DIAGNOSIS — F1120 Opioid dependence, uncomplicated: Secondary | ICD-10-CM | POA: Diagnosis not present

## 2023-12-06 DIAGNOSIS — Z79899 Other long term (current) drug therapy: Secondary | ICD-10-CM | POA: Diagnosis not present

## 2023-12-06 DIAGNOSIS — F330 Major depressive disorder, recurrent, mild: Secondary | ICD-10-CM | POA: Diagnosis not present

## 2023-12-06 DIAGNOSIS — F411 Generalized anxiety disorder: Secondary | ICD-10-CM | POA: Diagnosis not present

## 2023-12-06 DIAGNOSIS — Z6832 Body mass index (BMI) 32.0-32.9, adult: Secondary | ICD-10-CM | POA: Diagnosis not present

## 2024-01-03 DIAGNOSIS — F411 Generalized anxiety disorder: Secondary | ICD-10-CM | POA: Diagnosis not present

## 2024-01-03 DIAGNOSIS — F1120 Opioid dependence, uncomplicated: Secondary | ICD-10-CM | POA: Diagnosis not present

## 2024-01-03 DIAGNOSIS — F330 Major depressive disorder, recurrent, mild: Secondary | ICD-10-CM | POA: Diagnosis not present

## 2024-01-31 DIAGNOSIS — Z6833 Body mass index (BMI) 33.0-33.9, adult: Secondary | ICD-10-CM | POA: Diagnosis not present

## 2024-01-31 DIAGNOSIS — F330 Major depressive disorder, recurrent, mild: Secondary | ICD-10-CM | POA: Diagnosis not present

## 2024-01-31 DIAGNOSIS — F1120 Opioid dependence, uncomplicated: Secondary | ICD-10-CM | POA: Diagnosis not present

## 2024-01-31 DIAGNOSIS — Z79899 Other long term (current) drug therapy: Secondary | ICD-10-CM | POA: Diagnosis not present

## 2024-01-31 DIAGNOSIS — F411 Generalized anxiety disorder: Secondary | ICD-10-CM | POA: Diagnosis not present

## 2024-02-28 DIAGNOSIS — F330 Major depressive disorder, recurrent, mild: Secondary | ICD-10-CM | POA: Diagnosis not present

## 2024-02-28 DIAGNOSIS — F411 Generalized anxiety disorder: Secondary | ICD-10-CM | POA: Diagnosis not present

## 2024-02-28 DIAGNOSIS — F1120 Opioid dependence, uncomplicated: Secondary | ICD-10-CM | POA: Diagnosis not present

## 2024-03-29 DIAGNOSIS — Z79899 Other long term (current) drug therapy: Secondary | ICD-10-CM | POA: Diagnosis not present

## 2024-03-29 DIAGNOSIS — Z6833 Body mass index (BMI) 33.0-33.9, adult: Secondary | ICD-10-CM | POA: Diagnosis not present

## 2024-03-29 DIAGNOSIS — F411 Generalized anxiety disorder: Secondary | ICD-10-CM | POA: Diagnosis not present

## 2024-03-29 DIAGNOSIS — F330 Major depressive disorder, recurrent, mild: Secondary | ICD-10-CM | POA: Diagnosis not present

## 2024-03-29 DIAGNOSIS — F1120 Opioid dependence, uncomplicated: Secondary | ICD-10-CM | POA: Diagnosis not present

## 2024-04-25 DIAGNOSIS — F1120 Opioid dependence, uncomplicated: Secondary | ICD-10-CM | POA: Diagnosis not present

## 2024-04-25 DIAGNOSIS — F411 Generalized anxiety disorder: Secondary | ICD-10-CM | POA: Diagnosis not present

## 2024-04-25 DIAGNOSIS — F330 Major depressive disorder, recurrent, mild: Secondary | ICD-10-CM | POA: Diagnosis not present

## 2024-05-23 DIAGNOSIS — F411 Generalized anxiety disorder: Secondary | ICD-10-CM | POA: Diagnosis not present

## 2024-05-23 DIAGNOSIS — F1120 Opioid dependence, uncomplicated: Secondary | ICD-10-CM | POA: Diagnosis not present
